# Patient Record
Sex: Male | Born: 1980 | Race: Black or African American | Hispanic: No | Marital: Married | Smoking: Never smoker
Health system: Southern US, Community
[De-identification: ages and names within clinical notes are randomized; demographics above are authoritative.]

## PROBLEM LIST (undated history)

## (undated) DIAGNOSIS — T7840XA Allergy, unspecified, initial encounter: Secondary | ICD-10-CM

## (undated) DIAGNOSIS — Z91018 Allergy to other foods: Secondary | ICD-10-CM

## (undated) DIAGNOSIS — B009 Herpesviral infection, unspecified: Secondary | ICD-10-CM

## (undated) DIAGNOSIS — J309 Allergic rhinitis, unspecified: Secondary | ICD-10-CM

## (undated) HISTORY — DX: Allergic rhinitis, unspecified: J30.9

## (undated) HISTORY — DX: Allergy, unspecified, initial encounter: T78.40XA

## (undated) HISTORY — DX: Herpesviral infection, unspecified: B00.9

## (undated) HISTORY — DX: Allergy to other foods: Z91.018

---
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
## 2012-08-07 ENCOUNTER — Encounter: Payer: Self-pay | Admitting: Physician Assistant

## 2012-08-07 ENCOUNTER — Ambulatory Visit (INDEPENDENT_AMBULATORY_CARE_PROVIDER_SITE_OTHER): Payer: 59 | Admitting: Physician Assistant

## 2012-08-07 VITALS — BP 136/96 | HR 68 | Temp 98.1°F | Resp 18 | Ht 72.25 in | Wt 232.0 lb

## 2012-08-07 DIAGNOSIS — J029 Acute pharyngitis, unspecified: Secondary | ICD-10-CM

## 2012-08-07 LAB — RAPID STREP SCREEN (MED CTR MEBANE ONLY): Streptococcus, Group A Screen (Direct): NEGATIVE

## 2012-08-07 NOTE — Progress Notes (Signed)
   Patient ID: Mark Jarvis MRN: [id number], DOB: 05/25/1980, 32 y.o. Date of Encounter: 08/07/2012, 9:09 AM    Chief Complaint:  Chief Complaint  Patient presents with  . Other    x 1 week white spot in throat  gone now, sore throat off/on     HPI: 32 y.o. year old AA male states that about 6 days ago his throat felt a little sore. He looked at it in the mirror. Saw a little white spot in throat. Called and made appt here. Says his throat has not been sore any more. And, the white spot is now gone.  He has had no nasal congestion/mucus, no cough/chest congestion, no ear ache, no fever/chills.     Home Meds: See attached medication section for any medications that were entered at today's visit. The computer does not put those onto this list.The following list is a list of meds entered prior to today's visit.   No current outpatient prescriptions on file prior to visit.   No current facility-administered medications on file prior to visit.    Allergies: No Known Allergies    Review of Systems: See HPI for pertinent ROS. All other ROS negative.    Physical Exam: Blood pressure 136/96, pulse 68, temperature 98.1 F (36.7 C), temperature source Oral, resp. rate 18, height 6' 0.25" (1.835 m), weight 232 lb (105.235 kg)., Body mass index is 31.25 kg/(m^2). General: WNWD AAM. Appears in no acute distress. HEENT: Normocephalic, atraumatic, eyes without discharge, sclera non-icteric, nares are without discharge. Bilateral auditory canals clear, TM's are without perforation, pearly grey and translucent with reflective cone of light bilaterally. Oral cavity moist, posterior pharynx without exudate, erythema, peritonsillar abscess, or post nasal drip. No "white spots" no exudate. No tonsillar stones. No food material.   Neck: Supple. No thyromegaly. No lymphadenopathy. Lungs: Clear bilaterally to auscultation without wheezes, rales, or rhonchi. Breathing is unlabored. Heart: Regular  rhythm. No murmurs, rubs, or gallops. Msk:  Strength and tone normal for age. Extremities/Skin: Warm and dry. Neuro: Alert and oriented X 3. Moves all extremities spontaneously. Gait is normal. CNII-XII grossly in tact. Psych:  Responds to questions appropriately with a normal affect.   Results for orders placed in visit on 08/07/12  RAPID STREP SCREEN      Result Value Range   Source THROAT     Streptococcus, Group A Screen (Direct) NEG  NEGATIVE     ASSESSMENT AND PLAN:  32 y.o. year old male with  1. Viral pharyngitis  2. Sore throat - Rapid Strep Screen Negative.  Reassure pt that no teatment necessary. White spots he saw may have been sec to food or tonsillar stones. Has resolved.  Signed, [address] [place], [place], [facility] 08/07/2012 9:09 AM

## 2012-11-02 ENCOUNTER — Ambulatory Visit: Payer: 59 | Admitting: Family Medicine

## 2012-11-09 ENCOUNTER — Encounter: Payer: 59 | Admitting: Physician Assistant

## 2012-11-16 ENCOUNTER — Encounter: Payer: Self-pay | Admitting: Physician Assistant

## 2012-11-16 ENCOUNTER — Ambulatory Visit (INDEPENDENT_AMBULATORY_CARE_PROVIDER_SITE_OTHER): Payer: 59 | Admitting: Physician Assistant

## 2012-11-16 VITALS — BP 156/94 | HR 72 | Temp 98.2°F | Resp 18 | Ht 72.25 in | Wt 242.0 lb

## 2012-11-16 DIAGNOSIS — Z0000 Encounter for general adult medical examination without abnormal findings: Secondary | ICD-10-CM

## 2012-11-16 DIAGNOSIS — Z91018 Allergy to other foods: Secondary | ICD-10-CM

## 2012-11-16 DIAGNOSIS — T781XXA Other adverse food reactions, not elsewhere classified, initial encounter: Secondary | ICD-10-CM

## 2012-11-16 HISTORY — DX: Allergy to other foods: Z91.018

## 2012-11-17 NOTE — Progress Notes (Signed)
Patient ID: Mark Jarvis MRN: [id number], DOB: 1980-12-29 32 y.o. Date of Encounter: 11/17/2012, 7:48 PM    Chief Complaint: Physical (CPE)  HPI: 32 y.o. y/o AA male here for CPE.   Has been feeling well and has no complaints and just came in to get it a check up However he later asked if we can do allergy testing while he is here. I explained that he would have to see an allergist for that but asked him why he is requesting this. He reports that recently when eating certain foods he has developed itching around his mouth. Says he has noticed this when eating shrimp, apples, oranges. However he adds that whenever he has had these foods he has not eaten each food alone and has always been eating them together with other foods so is not certain exactly which foods are causing the reaction. He says he is going on a cruise in 2 weeks and wanted to have the testing done prior to the cruise. He has had no other symptoms of allergic reaction and has never had any itching tightness or swelling in his throat. Never had difficulty breathing.   Review of Systems: Consitutional: No fever, chills, fatigue, night sweats, lymphadenopathy, or weight changes. Eyes: No visual changes, eye redness, or discharge. ENT/Mouth: Ears: No otalgia, tinnitus, hearing loss, discharge. Nose: No congestion, rhinorrhea, sinus pain, or epistaxis. Throat: No sore throat, post nasal drip, or teeth pain. Cardiovascular: No CP, palpitations, diaphoresis, DOE, edema, orthopnea, PND. Respiratory: No cough, hemoptysis, SOB, or wheezing. Gastrointestinal: No anorexia, dysphagia, reflux, pain, nausea, vomiting, hematemesis, diarrhea, constipation, BRBPR, or melena. Genitourinary: No dysuria, frequency, urgency, hematuria, incontinence, nocturia, decreased urinary stream, discharge, impotence, or testicular pain/masses. Musculoskeletal: No decreased ROM, myalgias, stiffness, joint swelling, or weakness. Skin: No rash, erythema,  lesion changes, pain, warmth, jaundice, or pruritis. Neurological: No headache, dizziness, syncope, seizures, tremors, memory loss, coordination problems, or paresthesias. Psychological: No anxiety, depression, hallucinations, SI/HI. Endocrine: No fatigue, polydipsia, polyphagia, polyuria, or known diabetes. All other systems were reviewed and are otherwise negative.  Past Medical History  Diagnosis Date  . HSV-1 infection   . Multiple food allergies 11/16/2012     History reviewed. No pertinent past surgical history.  Home Meds:  No current outpatient prescriptions on file prior to visit.   No current facility-administered medications on file prior to visit.    Allergies: No Known Allergies  History   Social History  . Marital Status: Single    Spouse Name: N/A    Number of Children: N/A  . Years of Education: N/A   Occupational History  . 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 Retail banker    Social History Main Topics  . Smoking status: Never Smoker   . Smokeless tobacco: Never Used  . Alcohol Use: No  . Drug Use: No  . Sexual Activity: Not on file   Other Topics Concern  . Not on file   Social History Narrative   Works [employer].   Retail banker   Exercises: Mon/ Wed/Fri--Lifts Weights                    Tues/ Thurs--Runs 1.5 miles then leg exercises.      Single. Lives alone.    History reviewed. No pertinent family history.  Physical Exam: Blood pressure 136/84, pulse 72, temperature 98.2 F (36.8 C), temperature source Oral, resp. rate 18, height 6' 0.25" (1.835 m),  weight 242 lb (109.77 kg).  General: Well developed, well nourished,AAM. in no acute distress. HEENT: Normocephalic, atraumatic. Conjunctiva pink, sclera non-icteric. Pupils 2 mm constricting to 1 mm, round, regular, and equally reactive to light and accomodation. EOMI. Internal auditory canal clear. TMs with good cone of light and without  pathology. Nasal mucosa pink. Nares are without discharge. No sinus tenderness. Oral mucosa pink. Dentition good. Pharynx without exudate.   Neck: Supple. Trachea midline. No thyromegaly. Full ROM. No lymphadenopathy. Lungs: Clear to auscultation bilaterally without wheezes, rales, or rhonchi. Breathing is of normal effort and unlabored. Cardiovascular: RRR with S1 S2. No murmurs, rubs, or gallops. Distal pulses 2+ symmetrically. No carotid or abdominal bruits. Abdomen: Soft, non-tender, non-distended with normoactive bowel sounds. No hepatosplenomegaly or masses. No rebound/guarding. No CVA tenderness. No hernias. Musculoskeletal: Full range of motion and 5/5 strength throughout. Without swelling, atrophy, tenderness, crepitus, or warmth. Extremities without clubbing, cyanosis, or edema. Calves supple. Skin: Warm and moist without erythema, ecchymosis, wounds, or rash. Neuro: A+Ox3. CN II-XII grossly intact. Moves all extremities spontaneously. Full sensation throughout. Normal gait. DTR 2+ throughout upper and lower extremities. Finger to nose intact. Psych:  Responds to questions appropriately with a normal affect.   Assessment/Plan:  32 y.o. y/o  male here for CPE -1. Visit for preventive health examination A. Screening Labs: He is not fasting but will return to the clinic fasting for labs. - CBC with Differential; Future - COMPLETE METABOLIC PANEL WITH GFR; Future - Lipid panel; Future - TSH; Future - Vit D  25 hydroxy (rtn osteoporosis monitoring); Future  Immunizations: He defers flu vaccine. He reports a tetanus vaccine is up-to-date.  2. Multiple food allergies, initial encounter - Ambulatory referral to Allergy   Signed:   [address] [place], [place]  11/17/2012 7:48 PM

## 2012-11-20 ENCOUNTER — Other Ambulatory Visit: Payer: 59

## 2012-11-20 DIAGNOSIS — Z0000 Encounter for general adult medical examination without abnormal findings: Secondary | ICD-10-CM

## 2012-11-20 LAB — CBC WITH DIFFERENTIAL/PLATELET
Basophils Relative: 1 % (ref 0–1)
Eosinophils Absolute: 0.1 10*3/uL (ref 0.0–0.7)
MCH: 28 pg (ref 26.0–34.0)
MCHC: 33.7 g/dL (ref 30.0–36.0)
Neutrophils Relative %: 41 % — ABNORMAL LOW (ref 43–77)
Platelets: 225 10*3/uL (ref 150–400)
WBC: 4.3 10*3/uL (ref 4.0–10.5)

## 2012-11-20 LAB — COMPLETE METABOLIC PANEL WITH GFR
ALT: 34 U/L (ref 0–53)
Alkaline Phosphatase: 58 U/L (ref 39–117)
GFR, Est Non African American: 86 mL/min
Glucose, Bld: 81 mg/dL (ref 70–99)
Sodium: 139 mEq/L (ref 135–145)
Total Bilirubin: 0.7 mg/dL (ref 0.3–1.2)
Total Protein: 7.4 g/dL (ref 6.0–8.3)

## 2012-11-20 LAB — VITAMIN D 25 HYDROXY (VIT D DEFICIENCY, FRACTURES): Vit D, 25-Hydroxy: 29 ng/mL — ABNORMAL LOW (ref 30–89)

## 2012-11-20 LAB — LIPID PANEL
HDL: 45 mg/dL (ref >39)
Total CHOL/HDL Ratio: 4.3 Ratio

## 2012-11-20 LAB — TSH: TSH: 1.698 u[IU]/mL (ref 0.350–4.500)

## 2012-11-23 ENCOUNTER — Encounter: Payer: Self-pay | Admitting: Family Medicine

## 2013-01-30 ENCOUNTER — Ambulatory Visit (INDEPENDENT_AMBULATORY_CARE_PROVIDER_SITE_OTHER): Payer: 59 | Admitting: Family Medicine

## 2013-01-30 ENCOUNTER — Encounter: Payer: Self-pay | Admitting: Family Medicine

## 2013-01-30 VITALS — BP 118/80 | HR 82 | Temp 97.7°F | Resp 18 | Wt 246.0 lb

## 2013-01-30 DIAGNOSIS — S83509A Sprain of unspecified cruciate ligament of unspecified knee, initial encounter: Secondary | ICD-10-CM

## 2013-01-30 DIAGNOSIS — S83511A Sprain of anterior cruciate ligament of right knee, initial encounter: Secondary | ICD-10-CM

## 2013-01-30 NOTE — Progress Notes (Signed)
   Subjective:    Patient ID: Mark Jarvis, male    DOB: 1981-01-19, 33 y.o.   MRN: [id number]  HPI Patient is a very pleasant 33 year old [place] male who comes in with right knee pain.  He is a very muscular build. He is active in [employer]. 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  Therefore his jobs require physical strength and physical exertion.  Recently he was playing basketball. He jumped up to block a shot and when he landed he hyperextended his right knee. He felt immediate pain in the posterolateral aspect of the right knee. Ever since the knee joint feels unstable. He has minimal pain. When he does have pain it is well controlled with ibuprofen. However he feels very unsteady on his legs. He does like his knee joint may give out on him. Past Medical History  Diagnosis Date  . HSV-1 infection   . Multiple food allergies 11/16/2012   No current outpatient prescriptions on file prior to visit.   No current facility-administered medications on file prior to visit.   No Known Allergies History   Social History  . Marital Status: Single    Spouse Name: N/A    Number of Children: N/A  . Years of Education: N/A   Occupational History  . [employer]   . [employer]    Social History Main Topics  . Smoking status: Never Smoker   . Smokeless tobacco: Never Used  . Alcohol Use: No  . Drug Use: No  . Sexual Activity: Not on file   Other Topics Concern  . Not on file   Social History Narrative   [place] [employer]   [employer]   Exercises: Mon/ Wed/Fri--Lifts Weights                    Tues/ Thurs--Runs 1.5 miles then leg exercises.      Single. Lives alone.      Review of Systems  All other systems reviewed and are negative.       Objective:   Physical Exam  Vitals reviewed. Cardiovascular: Normal rate and regular rhythm.     Pulmonary/Chest: Effort normal and breath sounds normal.  Musculoskeletal:       Right knee: He exhibits abnormal meniscus. He exhibits normal range of motion, no swelling, no effusion, no ecchymosis, no erythema, normal alignment, no LCL laxity, normal patellar mobility, no bony tenderness and no MCL laxity. No medial joint line, no lateral joint line, no MCL and no LCL tenderness noted.   patient has tenderness with Aply grind test in the right postero-lateral meniscus.  Anterior drawer sign is positive for excessive forward movement of the tibia although the exam is difficult given his muscular build.        Assessment & Plan:  1. ACL sprain, right, initial encounter I suspect a sprain in the right anterior cruciate ligament or possibly a rupture. The patient's exam is difficult given his muscular build. Therefore I will schedule an MRI of the right knee as soon as possible.  I am also concerned he may have partially torn a meniscus. - MR Knee Right Wo Contrast; Future

## 2013-02-04 ENCOUNTER — Other Ambulatory Visit: Payer: Self-pay

## 2013-02-05 ENCOUNTER — Ambulatory Visit
Admission: RE | Admit: 2013-02-05 | Discharge: 2013-02-05 | Disposition: A | Discharge status: Home / Self Care | Payer: 59 | Source: Ambulatory Visit | Attending: Family Medicine | Admitting: Family Medicine

## 2013-02-05 DIAGNOSIS — S83511A Sprain of anterior cruciate ligament of right knee, initial encounter: Secondary | ICD-10-CM

## 2013-02-06 ENCOUNTER — Encounter: Payer: Self-pay | Admitting: Physician Assistant

## 2013-02-06 NOTE — Telephone Encounter (Signed)
Pt is calling about his test results Call back number is [phone]

## 2013-02-08 ENCOUNTER — Other Ambulatory Visit: Payer: Self-pay | Admitting: Family Medicine

## 2013-02-08 ENCOUNTER — Encounter: Payer: Self-pay | Admitting: Family Medicine

## 2013-02-08 DIAGNOSIS — M25569 Pain in unspecified knee: Secondary | ICD-10-CM

## 2013-02-09 NOTE — Telephone Encounter (Signed)
This encounter was created in error - please disregard.

## 2013-02-16 ENCOUNTER — Encounter: Payer: Self-pay | Admitting: Family Medicine

## 2013-02-16 DIAGNOSIS — T7840XA Allergy, unspecified, initial encounter: Secondary | ICD-10-CM | POA: Insufficient documentation

## 2013-02-16 DIAGNOSIS — J309 Allergic rhinitis, unspecified: Secondary | ICD-10-CM | POA: Insufficient documentation

## 2013-07-16 ENCOUNTER — Encounter: Payer: Self-pay | Admitting: Physician Assistant

## 2013-07-16 ENCOUNTER — Ambulatory Visit (INDEPENDENT_AMBULATORY_CARE_PROVIDER_SITE_OTHER): Admitting: Physician Assistant

## 2013-07-16 VITALS — BP 126/76 | HR 64 | Temp 98.4°F | Resp 18 | Wt 248.0 lb

## 2013-07-16 DIAGNOSIS — L259 Unspecified contact dermatitis, unspecified cause: Secondary | ICD-10-CM

## 2013-07-16 DIAGNOSIS — L239 Allergic contact dermatitis, unspecified cause: Secondary | ICD-10-CM

## 2013-07-16 MED ORDER — PREDNISONE 20 MG PO TABS: ORAL_TABLET | ORAL | Status: DC

## 2013-07-16 MED ORDER — METHYLPREDNISOLONE ACETATE 80 MG/ML IJ SUSP
80.0000 mg | Freq: Once | INTRAMUSCULAR | Status: AC
  Administered 2013-07-16: 80 mg via INTRAMUSCULAR

## 2013-07-16 NOTE — Addendum Note (Signed)
Addended by: Donne AnonPLUMMER, KIM M on: 07/16/2013 04:25 PM   Modules accepted: Orders

## 2013-07-16 NOTE — Progress Notes (Signed)
    Patient ID: Mark Jarvis MRN: [id number], DOB: November 11, 1980, 33 y.o. Date of Encounter: 07/16/2013, 3:55 PM    Chief Complaint:  Chief Complaint  Patient presents with  . poison all over     HPI: 33 y.o. year old AA male says that he has gotten into some poison oak or poison ivy. Says that he first noticed area of itchy rash on his neck on Wednesday 07/11/13. Then it spread to his wrist and since then has spread up both arms. Has some splotches on his chin and has areas on his legs as well. Has had no swelling or itching in the throat or neck and has had no difficulty breathing.     Home Meds:   Outpatient Prescriptions Prior to Visit  Medication Sig Dispense Refill  . diphenhydrAMINE (BENADRYL) 50 MG tablet Take 50 mg by mouth every 4 (four) hours as needed for allergies.      Marland Kitchen. EPINEPHrine (EPI-PEN) 0.3 mg/0.3 mL SOAJ injection Inject 0.3 mg into the muscle once.      . fluticasone (FLONASE) 50 MCG/ACT nasal spray Place 2 sprays into both nostrils daily.      . cetirizine (ZYRTEC) 10 MG tablet Take 10 mg by mouth daily.       No facility-administered medications prior to visit.    Allergies:  Allergies  Allergen Reactions  . Other     GRAPES  . Shrimp [Shellfish Allergy]       Review of Systems: See HPI for pertinent ROS. All other ROS negative.    Physical Exam: Blood pressure 126/76, pulse 64, temperature 98.4 F (36.9 C), temperature source Oral, resp. rate 18, weight 248 lb (112.492 kg)., Body mass index is 33.41 kg/(m^2). General:  AAM. Appears in no acute distress. Neck: Supple. No thyromegaly. No lymphadenopathy. Lungs: Clear bilaterally to auscultation without wheezes, rales, or rhonchi. Breathing is unlabored. Heart: Regular rhythm. No murmurs, rubs, or gallops. Msk:  Strength and tone normal for age. Extremities/Skin: He has patches of pink vessiicles on his wrists/forearms. He also has pink papules --some of them in linear distribution-- on his  forearms. He has pink papules splotch over his legs and some splotches on his chin and neck. Neuro: Alert and oriented X 3. Moves all extremities spontaneously. Gait is normal. CNII-XII grossly in tact. Psych:  Responds to questions appropriately with a normal affect.     ASSESSMENT AND PLAN:  33 y.o. year old male with  1. Allergic dermatitis Given his size I think he is going to need injection of 80 mg IM now. Then starting tomorrow he will start oral taper as below.  He says that he has had to miss the last 3 days of work because of this so I'm giving him a note for out of work for 6/19, 6/20,  6/21. If the itching and the rash do not resolve over the next 48 hours, or if it returns, then he is to follow up. - predniSONE (DELTASONE) 20 MG tablet; Take 3 daily for 2 days, then 2 daily for 2 days, then 1 daily for 2 days.  Dispense: 12 tablet; Refill: 0   Signed, [address], [place], [facility] 07/16/2013 3:55 PM

## 2014-02-25 DIAGNOSIS — J309 Allergic rhinitis, unspecified: Secondary | ICD-10-CM | POA: Insufficient documentation

## 2014-10-23 ENCOUNTER — Encounter: Payer: Self-pay | Admitting: Family Medicine

## 2014-10-23 ENCOUNTER — Ambulatory Visit (INDEPENDENT_AMBULATORY_CARE_PROVIDER_SITE_OTHER): Admitting: Family Medicine

## 2014-10-23 VITALS — BP 126/68 | HR 88 | Temp 97.8°F | Resp 16 | Ht 72.0 in | Wt 242.0 lb

## 2014-10-23 DIAGNOSIS — M7661 Achilles tendinitis, right leg: Secondary | ICD-10-CM

## 2014-10-23 MED ORDER — MELOXICAM 15 MG PO TABS: 15.0000 mg | ORAL_TABLET | Freq: Every day | ORAL | Status: DC

## 2014-10-23 NOTE — Progress Notes (Signed)
Patient ID: Mark Jarvis, male   DOB: 04-16-1980, 34 y.o.   MRN: [id number]   Subjective:    Patient ID: Mark Jarvis, male    DOB: [birth year], 33 y.o.   MRN: [id number]  Patient presents for R Achilles Tendon Pain  Patient here with right before meals least pain. He was out running a few weeks ago. He noticed some soreness in his foot after exercising but he never felt a pop or had any significant swelling initially. He noted that he continue to exercise is pain when he running or walking long distances typically over the Achilles area. He has had swelling the past couple weeks that he has not used any ice is not taking any laboratories. He was trying to work through it as he is currently active in the [employer]. He is due to start his PT this weekend. He denies any knee pain. Pain or back pain.   Review Of Systems:  GEN- denies fatigue, fever, weight loss,weakness, recent illness HEENT- denies eye drainage, change in vision, nasal discharge, CVS- denies chest pain, palpitations RESP- denies SOB, cough, wheeze ABD- denies N/V, change in stools, abd pain GU- denies dysuria, hematuria, dribbling, incontinence MSK- + joint pain, muscle aches, injury Neuro- denies headache, dizziness, syncope, seizure activity       Objective:    BP 126/68 mmHg  Pulse 88  Temp(Src) 97.8 F (36.6 C) (Oral)  Resp 16  Ht 6' (1.829 m)  Wt 242 lb (109.77 kg)  BMI 32.81 kg/m2 GEN- NAD, alert and oriented x3 MSK- Right ankle- good ROM, TTP over achilles, small nodule/swelling palpated center of Achilles, thompson test normal,neg squeeze test over midfoot Skin - intact         Assessment & Plan:      Problem List Items Addressed This Visit    None    Visit Diagnoses    Achilles tendinitis of right lower extremity    -  Primary    ICE, NSAIDS, obtain xray due to the swelling, pain just above achilles insertion, doubt tear based on range of motion, 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 Any transcriptional errors that result from this process are unintentional.

## 2014-10-23 NOTE — Patient Instructions (Addendum)
ICE, Get xray of  Of ankle -- [address]  Take anti-inflammatory  F/u 4 WEEKS FOR RECHECK If not improved Achilles Tendinitis Achilles tendinitis is inflammation of the tough, cord-like band that attaches the lower muscles of your leg to your heel (Achilles tendon). It is usually caused by overusing the tendon and joint involved.  CAUSES Achilles tendinitis can happen because of:  A sudden increase in exercise or activity (such as running).  Doing the same exercises or activities (such as jumping) over and over.  Not warming up calf muscles before exercising.  Exercising in shoes that are worn out or not made for exercise.  Having arthritis or a bone growth on the back of the heel bone. This can rub against the tendon and hurt the tendon. SIGNS AND SYMPTOMS The most common symptoms are:  Pain in the back of the leg, just above the heel. The pain usually gets worse with exercise and better with rest.  Stiffness or soreness in the back of the leg, especially in the morning.  Swelling of the skin over the Achilles tendon.  Trouble standing on tiptoe. Sometimes, an Achilles tendon tears (ruptures). Symptoms of an Achilles tendon rupture can include:  Sudden, severe pain in the back of the leg.  Trouble putting weight on the foot or walking normally. DIAGNOSIS Achilles tendinitis will be diagnosed based on symptoms and a physical examination. An X-ray may be done to check if another condition is causing your symptoms. An MRI may be ordered if your health care provider suspects you may have completely torn your tendon, which is called an Achilles tendon rupture.  TREATMENT  Achilles tendinitis usually gets better over time. It can take weeks to months to heal completely. Treatment focuses on treating the symptoms and helping the injury heal. HOME CARE INSTRUCTIONS   Rest your Achilles tendon and avoid activities that cause pain.  Apply ice to the injured  area:  Put ice in a plastic bag.  Place a towel between your skin and the bag.  Leave the ice on for 20 minutes, 2-3 times a day  Try to avoid using the tendon (other than gentle range of motion) while the tendon is painful. Do not resume use until instructed by your health care provider. Then begin use gradually. Do not increase use to the point of pain. If pain does develop, decrease use and continue the above measures. Gradually increase activities that do not cause discomfort until you achieve normal use.  Do exercises to make your calf muscles stronger and more flexible. Your health care provider or physical therapist can recommend exercises for you to do.  Wrap your ankle with an elastic bandage or other wrap. This can help keep your tendon from moving too much. Your health care provider will show you how to wrap your ankle correctly.  Only take over-the-counter or prescription medicines for pain, discomfort, or fever as directed by your health care provider. SEEK MEDICAL CARE IF:   Your pain and swelling increase or pain is uncontrolled with medicines.  You develop new, unexplained symptoms or your symptoms get worse.  You are unable to move your toes or foot.  You develop warmth and swelling in your foot.  You have an unexplained temperature. MAKE SURE YOU:   Understand these instructions.  Will watch your condition.  Will get help right away if you are not doing well or get worse. Document Released: 10/21/2004 Document Revised: 11/01/2012 Document Reviewed: 08/23/2012  ExitCare Patient Information 2015 [employer]. This information is not intended to replace advice given to you by your health care provider. Make sure you discuss any questions you have with your health care provider.  

## 2014-10-28 ENCOUNTER — Ambulatory Visit
Admission: RE | Admit: 2014-10-28 | Discharge: 2014-10-28 | Disposition: A | Discharge status: Home / Self Care | Payer: 59 | Source: Ambulatory Visit | Attending: Family Medicine | Admitting: Family Medicine

## 2014-10-28 ENCOUNTER — Other Ambulatory Visit: Payer: Self-pay | Admitting: *Deleted

## 2014-10-28 DIAGNOSIS — M7661 Achilles tendinitis, right leg: Secondary | ICD-10-CM

## 2014-11-26 ENCOUNTER — Other Ambulatory Visit (INDEPENDENT_AMBULATORY_CARE_PROVIDER_SITE_OTHER): Payer: 59

## 2014-11-26 ENCOUNTER — Encounter: Payer: Self-pay | Admitting: Family Medicine

## 2014-11-26 ENCOUNTER — Encounter: Payer: Self-pay | Admitting: *Deleted

## 2014-11-26 ENCOUNTER — Ambulatory Visit (INDEPENDENT_AMBULATORY_CARE_PROVIDER_SITE_OTHER): Payer: 59 | Admitting: Family Medicine

## 2014-11-26 VITALS — BP 118/80 | HR 70 | Ht 72.0 in | Wt 246.0 lb

## 2014-11-26 DIAGNOSIS — M6788 Other specified disorders of synovium and tendon, other site: Secondary | ICD-10-CM | POA: Insufficient documentation

## 2014-11-26 DIAGNOSIS — M7661 Achilles tendinitis, right leg: Secondary | ICD-10-CM

## 2014-11-26 DIAGNOSIS — M67879 Other specified disorders of synovium and tendon, unspecified ankle and foot: Secondary | ICD-10-CM | POA: Diagnosis not present

## 2014-11-26 DIAGNOSIS — M7660 Achilles tendinitis, unspecified leg: Secondary | ICD-10-CM

## 2014-11-26 NOTE — Progress Notes (Signed)
Pre visit review using our clinic review tool, if applicable. No additional management support is needed unless otherwise documented below in the visit note. 

## 2014-11-26 NOTE — Assessment & Plan Note (Signed)
Patient is a nodule noted 2 cm from the insertion. I do not see any tearing at the insertion but this is a concern for possible rupture at this level if continuing to enlarge. Patient is in [employer] and we will limited his activity avoiding any jumping or running at this time. Patient given home exercises and work with Event organiserathletic trainer. We discussed topical anti-inflammatories, icing protocol, patient heel lift into the shoe with daily activities. Patient will then come back and see me again in 3 weeks. If doing better we will advance accordingly. If doing worse patient would likely need to be nonweightbearing for 1-2 weeks.

## 2014-11-26 NOTE — Patient Instructions (Signed)
Good to see you.  Ice 20 minutes 2 times daily. Usually after activity and before bed. Exercises 3 times a week.  Avoid running, jumping or heavy lifting with legs pennsaid pinkie amount topically 2 times daily as needed.  Heel lift in right shoe See me again in 3-4 weeks

## 2014-11-26 NOTE — Progress Notes (Signed)
[facility] [address] [place], [place] [address] Phone: [phone] Subjective:    I'm seeing this patient by the request  of:  [facility] BETH, PA-C Dr. Jeanice Lim  CC: right ankle pain  [id number] Mark Jarvis is a 34 y.o. male coming in with complaint of right ankle pain. Patient states that he was running and started noticing a soreness in his foot and then the posterior aspect of his ankle. Never felt a pop. Patient is to remember any true swelling. Continue to try to exercise but continued to have pain. Seemed to start hurting him more on shorter and shorter distances. Patient then started having some mild swelling noted. Patient did not try any significant home modalities. 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 Patient rates the severity of pain is 4 out of 10 at this time. Seems to be worse after activity. Continues to run but when he tries to decelerate that seems to be the most pain.    Past Medical History  Diagnosis Date  . HSV-1 infection   . Multiple food allergies 11/16/2012  . Allergy     tree pollens,dust mites,cats,grasses  . Allergic rhinitis    No past surgical history on file. Social History  Substance Use Topics  . Smoking status: Never Smoker   . Smokeless tobacco: Never Used  . Alcohol Use: No   Allergies  Allergen Reactions  . Other     GRAPES  . Shrimp [Shellfish Allergy]      No family history on file.  Past medical history, social, surgical and family history all reviewed in electronic medical record.   Review of Systems: No headache, visual changes, nausea, vomiting, diarrhea, constipation, dizziness, abdominal pain, skin rash, fevers, chills, night sweats, weight loss, swollen lymph nodes, body aches, joint swelling, muscle aches, chest pain, shortness of breath, mood changes.   Objective Blood pressure 118/80, pulse 70, height 6' (1.829 m), weight 246 lb (111.585 kg), SpO2 97  %.  General: No apparent distress alert and oriented x3 mood and affect normal, dressed appropriately.  HEENT: Pupils equal, extraocular movements intact  Respiratory: Patient's speak in full sentences and does not appear short of breath  Cardiovascular: No lower extremity edema, non tender, no erythema  Skin: Warm dry intact with no signs of infection or rash on extremities or on axial skeleton.  Abdomen: Soft nontender  Neuro: Cranial nerves II through XII are intact, neurovascularly intact in all extremities with 2+ DTRs and 2+ pulses.  Lymph: No lymphadenopathy of posterior or anterior cervical chain or axillae bilaterally.  Gait normal with good balance and coordination.  MSK:  Non tender with full range of motion and good stability and symmetric strength and tone of shoulders, elbows, wrist, hip, knee bilaterally.  Ankle:right ankle Nodule noted over the right Achilles tendon this is tender to palpation. Range of motion is full in all directions. Strength is 5/5 in all directions. Stable lateral and medial ligaments; squeeze test and kleiger test unremarkable; Talar dome nontender; No pain at base of 5th MT; No tenderness over cuboid; No tenderness over N spot or navicular prominence No tenderness on posterior aspects of lateral and medial malleolus No sign of peroneal tendon subluxations or tenderness to palpation Negative tarsal tunnel tinel's Able to walk 4 steps. Negative Thompson test  MSK US performed of: right This study was ordered, performed, and interpreted by Terrilee Files D.O.  Foot/Ankle:   All structures visualized.  Talar dome unremarkable  Ankle mortise without effusion. Peroneus longus and brevis tendons unremarkable on long and transverse views without sheath effusions. Posterior tibialis, flexor hallucis longus, and flexor digitorum longus tendons unremarkable on long and transverse views without sheath effusions. Achilles tendon visualized along length of  tendon patient is a very large nodule noted proximally 2 cm proximal to its insertion. Hypoechoic changes and increasing Doppler flow noted. No true tear appreciated. No hypoechoic changes at the insertion itself. Anterior Talofibular Ligament and Calcaneofibular Ligaments unremarkable and intact. Deltoid Ligament unremarkable and intact. Plantar fascia intact and without effusion, normal thickness. No increased doppler signal, cap sign, or thickening of tibial cortex. Power doppler signal normal.  IMPRESSION:  Achilles tendinosis  Procedure note 97110; 15 minutes spent for Therapeutic exercises as stated in above notes.  This included exercises focusing on stretching, strengthening, with significant focus on eccentric aspects. Basic range of motion exercises to allow proper full motion at ankle Stretching of the lower leg and hamstrings  Theraband exercises for the lower leg - inversion, eversion, dorsiflexion and plantarflexion each to be completed with a theraband Balance exercises to increase proprioception Weight bearing exercises to increase strength and balance Proper technique shown and discussed handout in great detail with ATC.  All questions were discussed and answered.     Impression and Recommendations:     This case required medical decision making of moderate complexity.

## 2014-12-17 ENCOUNTER — Ambulatory Visit (INDEPENDENT_AMBULATORY_CARE_PROVIDER_SITE_OTHER): Payer: 59 | Admitting: Family Medicine

## 2014-12-17 ENCOUNTER — Encounter: Payer: Self-pay | Admitting: Family Medicine

## 2014-12-17 VITALS — BP 116/76 | HR 60 | Wt 244.0 lb

## 2014-12-17 DIAGNOSIS — M2141 Flat foot [pes planus] (acquired), right foot: Secondary | ICD-10-CM | POA: Diagnosis not present

## 2014-12-17 DIAGNOSIS — M2142 Flat foot [pes planus] (acquired), left foot: Secondary | ICD-10-CM | POA: Diagnosis not present

## 2014-12-17 DIAGNOSIS — M7661 Achilles tendinitis, right leg: Secondary | ICD-10-CM

## 2014-12-17 NOTE — Assessment & Plan Note (Signed)
Patient has made improvement. Encourage him to get a heel lift. We discussed possibly even a Cam Walker patient has any exacerbation. Patient does have significant pes planus that could also be contribute in. We may need to consider custom orthotics at some point. Patient has chronic continue with conservative therapy over the next 6 weeks and patient given an exercise per Scripture. Patient come back and see me again in 6 weeks for further evaluation and treatment.

## 2014-12-17 NOTE — Patient Instructions (Signed)
Good to see you Get the heel lift at [facility].  Ice is your friend Start running 2 times a week for the next 2 weeks then 3 times a week for 2 weeks then as much as you want Would avoid sprinting.  Continue the exercises See me again in 6 weeks.  If worse we may need to consider boot or custom orthotics.

## 2014-12-17 NOTE — Progress Notes (Signed)
[facility] [address] [place], [place] [address] Phone: [phone] Subjective:    I'm seeing this patient by the request  of:  [facility] BETH, PA-C Dr. Jeanice Lim   CC: right ankle pain follow up  [id number] Mark Jarvis is a 34 y.o. male coming in with complaint of right ankle pain. Patient was found to have more of an Achilles tendon notices without any significant inflammation. Patient was taking anti-inflammatory was to do home exercises, and was to get a heel lift. Patient did not get a heel lift and has been doing the exercises occasionally. Patient has not tried to run. Has been doing some other working out. Denies any numbness or tingling. States that he would state East 50-60% better. Still can be sore.    Past Medical History  Diagnosis Date  . HSV-1 infection   . Multiple food allergies 11/16/2012  . Allergy     tree pollens,dust mites,cats,grasses  . Allergic rhinitis    History reviewed. No pertinent past surgical history. Social History  Substance Use Topics  . Smoking status: Never Smoker   . Smokeless tobacco: Never Used  . Alcohol Use: No   Allergies  Allergen Reactions  . Other     GRAPES  . Shrimp [Shellfish Allergy]      History reviewed. No pertinent family history.  Past medical history, social, surgical and family history all reviewed in electronic medical record.   Review of Systems: No headache, visual changes, nausea, vomiting, diarrhea, constipation, dizziness, abdominal pain, skin rash, fevers, chills, night sweats, weight loss, swollen lymph nodes, body aches, joint swelling, muscle aches, chest pain, shortness of breath, mood changes.   Objective Blood pressure 116/76, pulse 60, weight 244 lb (110.678 kg), SpO2 98 %.  General: No apparent distress alert and oriented x3 mood and affect normal, dressed appropriately.  HEENT: Pupils equal, extraocular movements intact  Respiratory: Patient's speak in full  sentences and does not appear short of breath  Cardiovascular: No lower extremity edema, non tender, no erythema  Skin: Warm dry intact with no signs of infection or rash on extremities or on axial skeleton.  Abdomen: Soft nontender  Neuro: Cranial nerves II through XII are intact, neurovascularly intact in all extremities with 2+ DTRs and 2+ pulses.  Lymph: No lymphadenopathy of posterior or anterior cervical chain or axillae bilaterally.  Gait normal with good balance and coordination.  MSK:  Non tender with full range of motion and good stability and symmetric strength and tone of shoulders, elbows, wrist, hip, knee bilaterally.  Ankle:right ankle Nodule noted over the right Achilles tendon but smaller and less tender than previous exam. Still not pain-free. Range of motion is full in all directions. Strength is 5/5 in all directions. Stable lateral and medial ligaments; squeeze test and kleiger test unremarkable; Talar dome nontender; No pain at base of 5th MT; No tenderness over cuboid; No tenderness over N spot or navicular prominence No tenderness on posterior aspects of lateral and medial malleolus No sign of peroneal tendon subluxations or tenderness to palpation Negative tarsal tunnel tinel's Able to walk 4 steps. Negative Thompson test  Foot exam shows the patient does have severe pes planus bilaterally with mild over pronation of the hindfoot  MSK US performed of: right This study was ordered, performed, and interpreted by Terrilee Files D.O.  Foot/Ankle:   All structures visualized.   Talar dome unremarkable  Ankle mortise without effusion. Peroneus longus and brevis tendons unremarkable on  long and transverse views without sheath effusions. Posterior tibialis, flexor hallucis longus, and flexor digitorum longus tendons unremarkable on long and transverse views without sheath effusions. Achilles tendon visualized along length of tendon patient patient's nodule is 50%  decrease from previous exam. No true tear appreciated. No hypoechoic changes at the insertion itself. Anterior Talofibular Ligament and Calcaneofibular Ligaments unremarkable and intact. Deltoid Ligament unremarkable and intact. Plantar fascia intact and without effusion, normal thickness. No increased doppler signal, cap sign, or thickening of tibial cortex. Power doppler signal normal.  IMPRESSION:  Achilles tendinosis with no hypoechoic changes and seems to be smaller than previous exam    Impression and Recommendations:     This case required medical decision making of moderate complexity.

## 2015-01-28 ENCOUNTER — Ambulatory Visit (INDEPENDENT_AMBULATORY_CARE_PROVIDER_SITE_OTHER): Payer: 59 | Admitting: Family Medicine

## 2015-01-28 ENCOUNTER — Ambulatory Visit (INDEPENDENT_AMBULATORY_CARE_PROVIDER_SITE_OTHER): Payer: 59

## 2015-01-28 ENCOUNTER — Encounter: Payer: Self-pay | Admitting: Family Medicine

## 2015-01-28 VITALS — BP 138/80 | HR 83 | Ht 72.0 in | Wt 250.0 lb

## 2015-01-28 VITALS — BP 118/72 | HR 66 | Temp 98.3°F | Resp 14 | Ht 72.0 in | Wt 250.0 lb

## 2015-01-28 DIAGNOSIS — M7661 Achilles tendinitis, right leg: Secondary | ICD-10-CM

## 2015-01-28 DIAGNOSIS — M545 Low back pain, unspecified: Secondary | ICD-10-CM

## 2015-01-28 DIAGNOSIS — G8929 Other chronic pain: Secondary | ICD-10-CM | POA: Diagnosis not present

## 2015-01-28 MED ORDER — CYCLOBENZAPRINE HCL 10 MG PO TABS: 10.0000 mg | ORAL_TABLET | Freq: Three times a day (TID) | ORAL | Status: DC | PRN

## 2015-01-28 MED ORDER — IBUPROFEN 800 MG PO TABS: 800.0000 mg | ORAL_TABLET | Freq: Three times a day (TID) | ORAL | Status: DC | PRN

## 2015-01-28 NOTE — Progress Notes (Signed)
Patient ID: Mark Jarvis, male   DOB: 03/24/1980, 35 y.o.   MRN: [id number]      Subjective:    Patient ID: Mark ShownDarien K Jarvis, male    DOB: 03/24/1980, 35 y.o.   MRN: [id number]  Patient presents for R Lumbar Pain  Pt here with recurrent right low back pain. History of back pain for past few years. No specific injury he is aware of but , occurred initially while he was over sees, he thinks due to bumpy rides in the [facility] [place] and wearing the heavy packs.  He denies any radiating pain, mostly sits in right lower back. It flares up on and off, he will also get spasm. No change in bowel or bladder. Had flares 2 weeks, ago, always in same side, used Ibuprofen 800mg  and pain resolved. He is able to do regular activities, advised by his supervisors to have this evaluated.     Review Of Systems:  GEN- denies fatigue, fever, weight loss,weakness, recent illness HEENT- denies eye drainage, change in vision, nasal discharge, CVS- denies chest pain, palpitations RESP- denies SOB, cough, wheeze ABD- denies N/V, change in stools, abd pain GU- denies dysuria, hematuria, dribbling, incontinence MSK- +joint pain, +muscle aches, injury Neuro- denies headache, dizziness, syncope, seizure activity       Objective:    BP 118/72 mmHg  Pulse 66  Temp(Src) 98.3 F (36.8 C) (Oral)  Resp 14  Ht 6' (1.829 m)  Wt 250 lb (113.399 kg)  BMI 33.90 kg/m2 GEN- NAD, alert and oriented x3 MSK- TTP right paraspinals, Lumbar spine NT, neg SLR, good ROM spine, hips Neuro- normal tone LE, motor equal bilat, DTR symmetric, sensation in tact  EXT- No edema Pulses- Radial  2+        Assessment & Plan:      Problem List Items Addressed This Visit    None    Visit Diagnoses    Chronic low back pain    -  Primary    More MSK pain, no sign of spinal stenosis or nerve impingment, Given Ibuprofen 800mg  and Flexeril prn, obtain XRAY l spine, no current flare    Relevant Medications    ibuprofen (ADVIL,MOTRIN) 800  MG tablet    cyclobenzaprine (FLEXERIL) 10 MG tablet    Other Relevant Orders    DG Lumbar Spine Complete       Note: This dictation was prepared with Dragon dictation along with smaller phrase technology. Any transcriptional errors that result from this process are unintentional.

## 2015-01-28 NOTE — Progress Notes (Signed)
[facility] [address] [place], [place] [address] Phone: [phone] Subjective:       CC: right ankle pain follow up  Mark Jarvis is a 35 y.o. male coming in with complaint of right ankle pain. Patient was found to have more of an Achilles tendon notices without any significant inflammation. Patient was taking anti-inflammatory was to do home exercises, and was to get a heel lift. Patient did not get a heel lift and has been doing the exercises occasionally. Patient has started lifting a more regular basis. Patient is going to the gym and did not notice any significant pain. Feels some tightness overall. Proximal wing 90% better than his previous visit. No new symptoms.     Past Medical History  Diagnosis Date  . HSV-1 infection   . Multiple food allergies 11/16/2012  . Allergy     tree pollens,dust mites,cats,grasses  . Allergic rhinitis    No past surgical history on file. Social History  Substance Use Topics  . Smoking status: Never Smoker   . Smokeless tobacco: Never Used  . Alcohol Use: No   Allergies  Allergen Reactions  . Other     GRAPES     No family history on file. no pertinent family history as  Past medical history, social, surgical and family history all reviewed and no pertinent info pertaining to chief complaint. All other was reviewed using the EMR.    Review of Systems: No headache, visual changes, nausea, vomiting, diarrhea, constipation, dizziness, abdominal pain, skin rash, fevers, chills, night sweats, weight loss, swollen lymph nodes, body aches, joint swelling, muscle aches, chest pain, shortness of breath, mood changes.   Objective Blood pressure 138/80, pulse 83, height 6' (1.829 m), weight 250 lb (113.399 kg), SpO2 98 %.  General: No apparent distress alert and oriented x3 mood and affect normal, dressed appropriately.  HEENT: Pupils equal, extraocular movements intact  Respiratory: Patient's  speak in full sentences and does not appear short of breath  Cardiovascular: No lower extremity edema, non tender, no erythema  Skin: Warm dry intact with no signs of infection or rash on extremities or on axial skeleton.  Abdomen: Soft nontender  Neuro: Cranial nerves II through XII are intact, neurovascularly intact in all extremities with 2+ DTRs and 2+ pulses.  Lymph: No lymphadenopathy of posterior or anterior cervical chain or axillae bilaterally.  Gait normal with good balance and coordination.  MSK:  Non tender with full range of motion and good stability and symmetric strength and tone of shoulders, elbows, wrist, hip, knee bilaterally.  Ankle:right ankle Nodule present but minimal compared to previous exams. Nontender on exam with full range of motion Strength is 5/5 in all directions. Stable lateral and medial ligaments; squeeze test and kleiger test unremarkable; Talar dome nontender; No pain at base of 5th MT; No tenderness over cuboid; No tenderness over N spot or navicular prominence No tenderness on posterior aspects of lateral and medial malleolus No sign of peroneal tendon subluxations or tenderness to palpation Negative tarsal tunnel tinel's Able to walk 4 steps. Negative Thompson test  Foot exam shows the patient does have severe pes planus bilaterally with mild over pronation of the hindfoot  MSK US performed of: right This study was ordered, performed, and interpreted by Terrilee Files D.O.  Foot/Ankle:   All structures visualized.   Talar dome unremarkable  Ankle mortise without effusion. Peroneus longus and brevis tendons unremarkable on long and transverse views  without sheath effusions. Posterior tibialis, flexor hallucis longus, and flexor digitorum longus tendons unremarkable on long and transverse views without sheath effusions. Achilles tendon visualized along length of tendon patient patient's nodule is tingling to improve with very minimal enlargement from  baseline. Anterior Talofibular Ligament and Calcaneofibular Ligaments unremarkable and intact. Deltoid Ligament unremarkable and intact. Plantar fascia intact and without effusion, normal thickness. No increased doppler signal, cap sign, or thickening of tibial cortex. Power doppler signal normal.  IMPRESSION:  Improved Achilles tendinosis    Impression and Recommendations:     This case required medical decision making of moderate complexity.

## 2015-01-28 NOTE — Patient Instructions (Signed)
Great to see you You are doing great No restrictions starting in 1 week Continue the heel lift for another week or 2 Increase weight 15% a week until at your 100% If worsening symptoms take the medicine daily for 3 days then as needed See me again if any worsening symptoms Happy New Year!

## 2015-01-28 NOTE — Patient Instructions (Signed)
Get xray done  Take anti-inflammatory as needed and muscle relaxer  F/U as needed

## 2015-01-28 NOTE — Progress Notes (Signed)
Pre visit review using our clinic review tool, if applicable. No additional management support is needed unless otherwise documented below in the visit note. 

## 2015-01-28 NOTE — Assessment & Plan Note (Signed)
Significant improvement from previous exam. Discussed icing regimen and home exercises. Patient is coming continue to increase his activity as tolerated. Patient will follow-up again in 6 weeks if having any pain whatsoever.

## 2015-02-06 ENCOUNTER — Ambulatory Visit
Admission: RE | Admit: 2015-02-06 | Discharge: 2015-02-06 | Disposition: A | Discharge status: Home / Self Care | Payer: 59 | Source: Ambulatory Visit | Attending: Family Medicine | Admitting: Family Medicine

## 2015-02-06 DIAGNOSIS — M545 Low back pain, unspecified: Secondary | ICD-10-CM

## 2015-02-06 DIAGNOSIS — G8929 Other chronic pain: Secondary | ICD-10-CM

## 2015-03-25 ENCOUNTER — Encounter: Payer: Self-pay | Admitting: Family Medicine

## 2015-03-25 ENCOUNTER — Ambulatory Visit (INDEPENDENT_AMBULATORY_CARE_PROVIDER_SITE_OTHER): Payer: 59 | Admitting: Family Medicine

## 2015-03-25 VITALS — BP 122/64 | HR 82 | Temp 97.6°F | Resp 16 | Ht 72.0 in | Wt 244.0 lb

## 2015-03-25 DIAGNOSIS — J0100 Acute maxillary sinusitis, unspecified: Secondary | ICD-10-CM | POA: Diagnosis not present

## 2015-03-25 DIAGNOSIS — J309 Allergic rhinitis, unspecified: Secondary | ICD-10-CM | POA: Diagnosis not present

## 2015-03-25 MED ORDER — FLUTICASONE PROPIONATE 50 MCG/ACT NA SUSP: 2.0000 | Freq: Every day | NASAL | Status: DC

## 2015-03-25 MED ORDER — METHYLPREDNISOLONE ACETATE 40 MG/ML IJ SUSP
40.0000 mg | Freq: Once | INTRAMUSCULAR | Status: AC
  Administered 2015-03-25: 40 mg via INTRAMUSCULAR

## 2015-03-25 MED ORDER — AMOXICILLIN 875 MG PO TABS: 875.0000 mg | ORAL_TABLET | Freq: Two times a day (BID) | ORAL | Status: DC

## 2015-03-25 MED ORDER — CETIRIZINE HCL 10 MG PO TABS: 10.0000 mg | ORAL_TABLET | Freq: Every day | ORAL | Status: DC

## 2015-03-25 NOTE — Patient Instructions (Signed)
Sudafed- Ask pharmacist - take for 5 days  Take antibiotics as prescribed Take zyrtec  Flonase as prescribed Steroid shot given

## 2015-03-25 NOTE — Progress Notes (Signed)
Patient ID: Mark Jarvis, male   DOB: December 10, 1980, 36 y.o.   MRN: [id number]   Subjective:    Patient ID: Mark Jarvis, male    DOB: 02-08-1980, 35 y.o.   MRN: [id number]  Patient presents for Illness  patient here with worsening allergies. He also now has sinus pressure and pain in the spring going on for the past 2 weeks or more. He been taking Allegra occasional use of Flonase. He has known seasonal allergies and rhinitis. He has not had any fever but has had headache as well as some pain into his gum area. He also noticed a change in his mucus. He has not had any significant cough no fever.    Review Of Systems:  GEN- denies fatigue, fever, weight loss,weakness, recent illness HEENT- denies eye drainage, change in vision,+ nasal discharge, CVS- denies chest pain, palpitations RESP- denies SOB, cough, wheeze Neuro- + headache, dizziness, syncope, seizure activity       Objective:    BP 122/64 mmHg  Pulse 82  Temp(Src) 97.6 F (36.4 C) (Rectal)  Resp 16  Ht 6' (1.829 m)  Wt 244 lb (110.678 kg)  BMI 33.09 kg/m2 GEN- NAD, alert and oriented x3 HEENT- PERRL, EOMI, non injected sclera, pink conjunctiva, MMM, oropharynx mild injection, TM clear bilat no effusion,  + maxillary sinus tenderness, inflammed turbinates,  Nasal drainage , swelling over sinus region  Neck- Supple, shotty ant LAD CVS- RRR, no murmur RESP-CTAB EXT- No edema Pulses- Radial 2+        Assessment & Plan:      Problem List Items Addressed This Visit    Allergic rhinitis - Primary   Relevant Medications   methylPREDNISolone acetate (DEPO-MEDROL) injection 40 mg (Completed)    Other Visit Diagnoses    Acute maxillary sinusitis, recurrence not specified        Rhinitis with overlying sinutisis, treat with amox, zyrtec works better for him, flonasae, Water engineer with Sudafed    Relevant Medications    cetirizine (ZYRTEC) 10 MG tablet    amoxicillin (AMOXIL) 875 MG tablet    fluticasone (FLONASE)  50 MCG/ACT nasal spray    methylPREDNISolone acetate (DEPO-MEDROL) injection 40 mg (Completed)       Note: This dictation was prepared with Dragon dictation along with smaller phrase technology. Any transcriptional errors that result from this process are unintentional.

## 2015-07-07 ENCOUNTER — Ambulatory Visit (INDEPENDENT_AMBULATORY_CARE_PROVIDER_SITE_OTHER): Payer: 59 | Admitting: Family Medicine

## 2015-07-07 ENCOUNTER — Encounter: Payer: Self-pay | Admitting: Family Medicine

## 2015-07-07 VITALS — BP 122/62 | HR 68 | Temp 98.3°F | Resp 16 | Ht 72.0 in | Wt 250.0 lb

## 2015-07-07 DIAGNOSIS — S76111A Strain of right quadriceps muscle, fascia and tendon, initial encounter: Secondary | ICD-10-CM | POA: Diagnosis not present

## 2015-07-07 MED ORDER — DICLOFENAC SODIUM 75 MG PO TBEC: 75.0000 mg | DELAYED_RELEASE_TABLET | Freq: Two times a day (BID) | ORAL | Status: DC

## 2015-07-07 MED ORDER — HYDROCODONE-ACETAMINOPHEN 5-325 MG PO TABS: 1.0000 | ORAL_TABLET | Freq: Four times a day (QID) | ORAL | Status: DC | PRN

## 2015-07-07 NOTE — Patient Instructions (Signed)
MRI to be done Take diclofenac twice a day  ICE your thigh  F/U pending results

## 2015-07-08 NOTE — Progress Notes (Signed)
Patient ID: Mark ShownDarien K Dilworth, male   DOB: 31-Mar-1980, 35 y.o.   MRN: [id number]    Subjective:    Patient ID: Mark Jarvis, male    DOB: 31-Mar-1980, 35 y.o.   MRN: [id number]  Patient presents for R Upper Thigh Pain  Patient presents with injury to his right thigh. He was in his physical training for the [employer] he was playing kickball when he went to kick the ball he felt a pop had some pain directly afterwards. He did not notice any significant swelling at that time. 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 Yesterday he made a sudden movement he felt another pop and now has significant bulge in the middle of his thigh which is very painful. He has difficulty ambulating. He actually has history of quads rupture when he was a teenager. He did not require any surgery at that time. He took a couple doses of ibuprofen which has helped minimally. He denies any pain in his back or his knee everything is isolated in the thigh and up into the groin.    Review Of Systems:  GEN- denies fatigue, fever, weight loss,weakness, recent illness HEENT- denies eye drainage, change in vision, nasal discharge, CVS- denies chest pain, palpitations RESP- denies SOB, cough, wheeze ABD- denies N/V, change in stools, abd pain GU- denies dysuria, hematuria, dribbling, incontinence MSK- denies joint pain, +muscle aches, injury Neuro- denies headache, dizziness, syncope, seizure activity       Objective:    BP 122/62 mmHg  Pulse 68  Temp(Src) 98.3 F (36.8 C) (Oral)  Resp 16  Ht 6' (1.829 m)  Wt 250 lb (113.399 kg)  BMI 33.90 kg/m2 GEN- NAD, alert and oriented x3 MSK- Spine- NT, good ROM, Hips pain with IR, otherwise normal ROM, normal ROM knee  TTP Right groin significant bulge at center of right thigh, TTP, mild bruising, antalgic gait, decreased strength right upper leg compared to left  EXT- No edema Pulses- Radial, DP- 2+        Assessment & Plan:       Problem List Items Addressed This Visit    None    Visit Diagnoses    Quadriceps muscle rupture, right, initial encounter    -  Primary    based on exam concern for quads rupture or rectus femoris rupture/injury based on mechanism, given notes for work, ICE, elevate, start diclofenac, norco at bedtime, MRI to be done, ortho referral        Note: This dictation was prepared with Dragon dictation along with smaller Lobbyistphrase technology. Any transcriptional errors that result from this process are unintentional.

## 2015-07-09 ENCOUNTER — Ambulatory Visit
Admission: RE | Admit: 2015-07-09 | Discharge: 2015-07-09 | Disposition: A | Discharge status: Home / Self Care | Payer: 59 | Source: Ambulatory Visit | Attending: Family Medicine | Admitting: Family Medicine

## 2015-07-09 ENCOUNTER — Telehealth: Payer: Self-pay | Admitting: Family Medicine

## 2015-07-09 ENCOUNTER — Other Ambulatory Visit: Payer: Self-pay | Admitting: Family Medicine

## 2015-07-09 DIAGNOSIS — S76811A Strain of other specified muscles, fascia and tendons at thigh level, right thigh, initial encounter: Secondary | ICD-10-CM

## 2015-07-09 DIAGNOSIS — S76111A Strain of right quadriceps muscle, fascia and tendon, initial encounter: Secondary | ICD-10-CM

## 2015-07-09 NOTE — Telephone Encounter (Signed)
Please mail pt copy of his MRI  I spoke with pt, and [facility] Dr. Neldon NewportMuprhy who agrees to see pt, to determine if surgical needed or just PT   Will place referral for ortho consult

## 2015-07-09 NOTE — Telephone Encounter (Signed)
Records faxed to [facility]

## 2015-07-11 ENCOUNTER — Telehealth: Payer: Self-pay | Admitting: Family Medicine

## 2015-07-11 ENCOUNTER — Encounter: Payer: Self-pay | Admitting: Family Medicine

## 2015-07-11 NOTE — Telephone Encounter (Signed)
Pt out of work Sat-Sun-Mon-Tues.  Gave him work note from 07/08/15 thru 07/16/15 when he sees Ortho.

## 2015-07-11 NOTE — Telephone Encounter (Signed)
Agree with above 

## 2015-07-11 NOTE — Telephone Encounter (Signed)
Patient called requesting a work note for 07/07/15-07/14/15. He states he was unable to work Tuesday he said that you have given him a note for light duty but is now requesting a note for this week.  CB#[phone]

## 2016-01-14 ENCOUNTER — Encounter: Payer: Self-pay | Admitting: Family Medicine

## 2016-01-14 ENCOUNTER — Encounter: Payer: Self-pay | Admitting: Physician Assistant

## 2016-01-14 ENCOUNTER — Ambulatory Visit (INDEPENDENT_AMBULATORY_CARE_PROVIDER_SITE_OTHER): Payer: 59 | Admitting: Physician Assistant

## 2016-01-14 VITALS — BP 122/78 | HR 82 | Temp 98.0°F | Resp 16 | Wt 248.0 lb

## 2016-01-14 DIAGNOSIS — Z0000 Encounter for general adult medical examination without abnormal findings: Secondary | ICD-10-CM

## 2016-01-14 DIAGNOSIS — Z113 Encounter for screening for infections with a predominantly sexual mode of transmission: Secondary | ICD-10-CM

## 2016-01-14 NOTE — Progress Notes (Signed)
Patient ID: Mark Jarvis MRN: [id number], DOB: [birth year] 35 y.o. Date of Encounter: 01/14/2016, 10:48 AM    Chief Complaint: Physical (CPE)  HPI: 35 y.o. y/o male here for CPE.   He does request to do STD screening. He is having no symptoms just wants to do screening. He has no other concerns to discuss today.  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 Continues to be in the [employer].  Review of Systems: Consitutional: No fever, chills, fatigue, night sweats, lymphadenopathy, or weight changes. Eyes: No visual changes, eye redness, or discharge. ENT/Mouth: Ears: No otalgia, tinnitus, hearing loss, discharge. Nose: No congestion, rhinorrhea, sinus pain, or epistaxis. Throat: No sore throat, post nasal drip, or teeth pain. Cardiovascular: No CP, palpitations, diaphoresis, DOE, edema, orthopnea, PND. Respiratory: No cough, hemoptysis, SOB, or wheezing. Gastrointestinal: No anorexia, dysphagia, reflux, pain, nausea, vomiting, hematemesis, diarrhea, constipation, BRBPR, or melena. Genitourinary: No dysuria, frequency, urgency, hematuria, incontinence, nocturia, decreased urinary stream, discharge, impotence, or testicular pain/masses. Musculoskeletal: No decreased ROM, myalgias, stiffness, joint swelling, or weakness. Skin: No rash, erythema, lesion changes, pain, warmth, jaundice, or pruritis. Neurological: No headache, dizziness, syncope, seizures, tremors, memory loss, coordination problems, or paresthesias. Psychological: No anxiety, depression, hallucinations, SI/HI. Endocrine: No fatigue, polydipsia, polyphagia, polyuria, or known diabetes. All other systems were reviewed and are otherwise negative.  Past Medical History:  Diagnosis Date  . Allergic rhinitis   . Allergy    tree pollens,dust mites,cats,grasses  . HSV-1 infection   . Multiple food allergies 11/16/2012     No past surgical history on file.  Home Meds:    Outpatient Medications Prior to Visit  Medication Sig Dispense Refill  . cetirizine (ZYRTEC) 10 MG tablet Take 1 tablet (10 mg total) by mouth daily. 30 tablet 11  . EPINEPHrine (EPI-PEN) 0.3 mg/0.3 mL SOAJ injection Inject 0.3 mg into the muscle once.    . fluticasone (FLONASE) 50 MCG/ACT nasal spray Place 2 sprays into both nostrils daily. Reported on 03/25/2015 16 g 3  . HYDROcodone-acetaminophen (NORCO) 5-325 MG tablet Take 1 tablet by mouth every 6 (six) hours as needed for moderate pain. 30 tablet 0  . diclofenac (VOLTAREN) 75 MG EC tablet Take 1 tablet (75 mg total) by mouth 2 (two) times daily. (Patient not taking: Reported on 01/14/2016) 60 tablet 1   No facility-administered medications prior to visit.     Allergies: No Known Allergies  Social History   Social History  . Marital status: Single    Spouse name: N/A  . Number of children: N/A  . Years of education: N/A   Occupational History  . [place] [employer]   . [employer]    Social History Main Topics  . Smoking status: Never Smoker  . Smokeless tobacco: Never Used  . Alcohol use No  . Drug use: No  . Sexual activity: Not on file   Other Topics Concern  . Not on file   Social History Narrative   [place] [employer]   [employer]   Exercises: Mon/ Wed/Fri--Lifts Weights                    Tues/ Thurs--Runs 1.5 miles then leg exercises.      Single. Lives alone.    No family history on file.  Physical Exam: Blood pressure 122/78, pulse 82, temperature 98 F (36.7 C), temperature source Oral, resp. rate 16, weight 248 lb (  112.5 kg), SpO2 98 %.  General: Well developed, well nourished, AAM. Appears in no acute distress. HEENT: Normocephalic, atraumatic. Conjunctiva pink, sclera non-icteric. Pupils 2 mm constricting to 1 mm, round, regular, and equally reactive to light and accomodation. EOMI. Internal auditory canal clear. TMs with good cone of  light and without pathology. Nasal mucosa pink. Nares are without discharge. No sinus tenderness. Oral mucosa pink. Dentition good. Pharynx without exudate.   Neck: Supple. Trachea midline. No thyromegaly. Full ROM. No lymphadenopathy. Lungs: Clear to auscultation bilaterally without wheezes, rales, or rhonchi. Breathing is of normal effort and unlabored. Cardiovascular: RRR with S1 S2. No murmurs, rubs, or gallops. Distal pulses 2+ symmetrically. No carotid or abdominal bruits. Abdomen: Soft, non-tender, non-distended with normoactive bowel sounds. No hepatosplenomegaly or masses. No rebound/guarding. No CVA tenderness. No hernias. Musculoskeletal: Full range of motion and 5/5 strength throughout. Without swelling. Skin: Warm and moist without erythema, ecchymosis, wounds, or rash. Neuro: A+Ox3. CN II-XII grossly intact. Moves all extremities spontaneously. Full sensation throughout. Normal gait. DTR 2+ throughout upper and lower extremities.  Psych:  Responds to questions appropriately with a normal affect.   Assessment/Plan:  35 y.o. y/o  AA  male here for CPE  -1. Encounter for preventive health examination A. Screening Labs: He is not fasting today. However he can return Friday morning fasting for labs. - CBC with Differential/Platelet; Future - COMPLETE METABOLIC PANEL WITH GFR; Future - Lipid panel; Future - TSH; Future  B. Screening For Prostate Cancer: He has no indication to require this until age 35 (African American)  C. Screening For Colorectal Cancer:  He has no indication to require this until age 35  D. Immunizations:  States that he gets all immunizations through [employer] and they keep him up-to-date on these.           Received The flu vaccine 10/2015 and knows that he got the tetanus shot either 2015 or 2016. Flu Tetanus Pneumococcal Zostavax   2. Screening for STDs (sexually transmitted diseases) Check these when we do his labs Friday. He is to drink water  so he can labor urine sample at that time for the gonorrhea chlamydia test. - GC/Chlamydia Probe Amp; Future - Hepatitis panel, acute; Future - HIV antibody; Future - HSV(herpes smplx)abs-1+2(IgG+IgM)-bld; Future - RPR; Future     Signed:   [address] Beth [place], [place]  01/14/2016 10:48 AM

## 2016-01-16 ENCOUNTER — Other Ambulatory Visit: Payer: 59

## 2016-01-16 DIAGNOSIS — Z113 Encounter for screening for infections with a predominantly sexual mode of transmission: Secondary | ICD-10-CM

## 2016-01-16 DIAGNOSIS — Z0000 Encounter for general adult medical examination without abnormal findings: Secondary | ICD-10-CM

## 2016-01-16 LAB — CBC WITH DIFFERENTIAL/PLATELET
Basophils Absolute: 0 cells/uL (ref 0–200)
Basophils Relative: 0 %
EOS ABS: 132 {cells}/uL (ref 15–500)
Eosinophils Relative: 3 %
HEMATOCRIT: 44.6 % (ref 38.5–50.0)
Hemoglobin: 14.9 g/dL (ref 13.0–17.0)
LYMPHS PCT: 35 %
Lymphs Abs: 1540 cells/uL (ref 850–3900)
MCH: 27.5 pg (ref 27.0–33.0)
MCHC: 33.4 g/dL (ref 32.0–36.0)
MCV: 82.3 fL (ref 80.0–100.0)
MONO ABS: 440 {cells}/uL (ref 200–950)
MONOS PCT: 10 %
MPV: 10 fL (ref 7.5–12.5)
NEUTROS PCT: 52 %
Neutro Abs: 2288 cells/uL (ref 1500–7800)
Platelets: 248 10*3/uL (ref 140–400)
RBC: 5.42 MIL/uL (ref 4.20–5.80)
RDW: 14.5 % (ref 11.0–15.0)
WBC: 4.4 10*3/uL (ref 3.8–10.8)

## 2016-01-16 LAB — TSH: TSH: 1.09 mIU/L (ref 0.40–4.50)

## 2016-01-17 LAB — COMPLETE METABOLIC PANEL WITH GFR
ALT: 29 U/L (ref 9–46)
AST: 26 U/L (ref 10–40)
Albumin: 4.1 g/dL (ref 3.6–5.1)
Alkaline Phosphatase: 52 U/L (ref 40–115)
BUN: 13 mg/dL (ref 7–25)
CALCIUM: 9.4 mg/dL (ref 8.6–10.3)
CHLORIDE: 107 mmol/L (ref 98–110)
CO2: 20 mmol/L (ref 20–31)
CREATININE: 0.94 mg/dL (ref 0.60–1.35)
Glucose, Bld: 86 mg/dL (ref 70–99)
POTASSIUM: 4.4 mmol/L (ref 3.5–5.3)
Sodium: 142 mmol/L (ref 135–146)
Total Bilirubin: 0.4 mg/dL (ref 0.2–1.2)
Total Protein: 7.2 g/dL (ref 6.1–8.1)

## 2016-01-17 LAB — LIPID PANEL
CHOLESTEROL: 204 mg/dL — AB (ref <200)
HDL: 42 mg/dL (ref >40)
LDL Cholesterol: 125 mg/dL — ABNORMAL HIGH (ref <100)
TRIGLYCERIDES: 185 mg/dL — AB (ref <150)
Total CHOL/HDL Ratio: 4.9 Ratio (ref <5.0)
VLDL: 37 mg/dL — AB (ref <30)

## 2016-01-17 LAB — HIV ANTIBODY (ROUTINE TESTING W REFLEX): HIV: NONREACTIVE

## 2016-01-17 LAB — HEPATITIS PANEL, ACUTE
HCV AB: NEGATIVE
Hep A IgM: NONREACTIVE
Hep B C IgM: NONREACTIVE
Hepatitis B Surface Ag: NEGATIVE

## 2016-01-17 LAB — GC/CHLAMYDIA PROBE AMP
CT Probe RNA: NOT DETECTED
GC PROBE AMP APTIMA: NOT DETECTED

## 2016-01-17 LAB — RPR

## 2016-02-18 ENCOUNTER — Encounter: Payer: 59 | Admitting: Family Medicine

## 2016-04-12 ENCOUNTER — Encounter: Payer: Self-pay | Admitting: Family Medicine

## 2016-04-12 ENCOUNTER — Ambulatory Visit (INDEPENDENT_AMBULATORY_CARE_PROVIDER_SITE_OTHER): Payer: 59 | Admitting: Family Medicine

## 2016-04-12 VITALS — BP 110/60 | HR 86 | Temp 98.7°F | Resp 16 | Ht 72.0 in | Wt 253.0 lb

## 2016-04-12 DIAGNOSIS — G8929 Other chronic pain: Secondary | ICD-10-CM

## 2016-04-12 DIAGNOSIS — M6788 Other specified disorders of synovium and tendon, other site: Secondary | ICD-10-CM

## 2016-04-12 DIAGNOSIS — M722 Plantar fascial fibromatosis: Secondary | ICD-10-CM

## 2016-04-12 DIAGNOSIS — J3089 Other allergic rhinitis: Secondary | ICD-10-CM | POA: Diagnosis not present

## 2016-04-12 DIAGNOSIS — M7660 Achilles tendinitis, unspecified leg: Secondary | ICD-10-CM | POA: Diagnosis not present

## 2016-04-12 DIAGNOSIS — M5442 Lumbago with sciatica, left side: Secondary | ICD-10-CM

## 2016-04-12 MED ORDER — LEVOCETIRIZINE DIHYDROCHLORIDE 5 MG PO TABS: 5.0000 mg | ORAL_TABLET | Freq: Every evening | ORAL | 6 refills | Status: DC

## 2016-04-12 MED ORDER — METHOCARBAMOL 500 MG PO TABS: 500.0000 mg | ORAL_TABLET | Freq: Four times a day (QID) | ORAL | 0 refills | Status: DC

## 2016-04-12 MED ORDER — HYDROCODONE-ACETAMINOPHEN 5-325 MG PO TABS: 1.0000 | ORAL_TABLET | Freq: Four times a day (QID) | ORAL | 0 refills | Status: DC | PRN

## 2016-04-12 MED ORDER — IPRATROPIUM BROMIDE 0.03 % NA SOLN: 2.0000 | Freq: Two times a day (BID) | NASAL | 6 refills | Status: DC

## 2016-04-12 MED ORDER — METHYLPREDNISOLONE 4 MG PO TBPK: ORAL_TABLET | ORAL | 0 refills | Status: DC

## 2016-04-12 NOTE — Assessment & Plan Note (Signed)
Trial of xyzal, atrovent nasal spray

## 2016-04-12 NOTE — Assessment & Plan Note (Signed)
He has symptoms of plantar fasciitis in his artery and to have tendinitis of his Achilles. Do recommend that he follow with the sports medicine physician who is on ultrasound on him in the past to monitor his tendons

## 2016-04-12 NOTE — Progress Notes (Signed)
Subjective:    Patient ID: Mark Jarvis, male    DOB: 01/07/1981, 36 y.o.   MRN: [id number]  Patient presents for B Lumbar Back Pain (intermittent back pain with radiation down leg) and Seasonal Allergies (states that he has constant nasal drainage and allergy sx)   Pt here with chronic back pain flare, intermittant pain down right leg, with numbness and tingling , increasing episodes of pain.  He has been treated for back pain pain by our office a few times over the past few years. 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 , uses motrin as needed,has also been on flexeril in the past   No change in bowel or bladder   Xray showed mild right sided curvature of lumbar spine but otherwise normal in Jan 2017   allergies- nasal drainage, itchy  - using  zyrtec and flonase  But, keeping symptoms all year round.He was exposed to some burning chemicals when he was deployed      He also noticed he gets some bilateral foot pain near his heels and into the mid part of his feet when he runs he's also had some mild discomfort near his acuities was not sure if he needs to follow-up with his sports medicine physician  Review Of Systems:  GEN- denies fatigue, fever, weight loss,weakness, recent illness HEENT- denies eye drainage, change in vision, +nasal discharge, CVS- denies chest pain, palpitations RESP- denies SOB, cough, wheeze ABD- denies N/V, change in stools, abd pain GU- denies dysuria, hematuria, dribbling, incontinence MSK- + joint pain, muscle aches, injury Neuro- denies headache, dizziness, syncope, seizure activity       Objective:    BP 110/60   Pulse 86   Temp 98.7 F (37.1 C) (Oral)   Resp 16   Ht 6' (1.829 m)   Wt 253 lb (114.8 kg)   SpO2 98%   BMI 34.31 kg/m  GEN- NAD, alert and oriented x3 HEENT- PERRL, EOMI, non injected sclera, pink conjunctiva, MMM, oropharynx clear, + enlarged turbinates, + clear rhinorrhea, no maxillary sinus tenderness  Neck-  Supple, nno LAD  CVS- RRR, no murmur RESP-CTAB MSK - TTP midline lumbar spine and left paraspinals, mild spasm, +SLR Left side Neuro antalgic gait, DTR symmetric, sensation in tact, motor equal bilat  Feet- mild TTP plantar insertion, small nodule right achilles EXT- No edema Pulses- Radial, DP- 2+        Assessment & Plan:      Problem List Items Addressed This Visit    Allergic rhinitis    Trial of xyzal, atrovent nasal spray       Achilles tendinosis    He has symptoms of plantar fasciitis in his artery and to have tendinitis of his Achilles. Do recommend that he follow with the sports medicine physician who is on ultrasound on him in the past to monitor his tendons       Other Visit Diagnoses    Plantar fasciitis    -  Primary   Chronic midline low back pain with left-sided sciatica       Concern for progressive back pain with sciatica symptoms, he has done supportive care, home exercises, NSAIDS. Treat today with medrol dosepak, robaxin, norco, MRI to be done    Relevant Medications   methylPREDNISolone (MEDROL DOSEPAK) 4 MG TBPK tablet   HYDROcodone-acetaminophen (NORCO) 5-325 MG tablet   methocarbamol (ROBAXIN) 500 MG tablet   Other Relevant Orders   MR Lumbar Spine Wo  Contrast      Note: This dictation was prepared with Dragon dictation along with smaller phrase technology. Any transcriptional errors that result from this process are unintentional.

## 2016-04-12 NOTE — Patient Instructions (Addendum)
MRI to be done Take steroid dose pak Call Dr.Smith for your ankle/feet F/U pending results  Plantar Fasciitis Rehab Ask your health care provider which exercises are safe for you. Do exercises exactly as told by your health care provider and adjust them as directed. It is normal to feel mild stretching, pulling, tightness, or discomfort as you do these exercises, but you should stop right away if you feel sudden pain or your pain gets worse. Do not begin these exercises until told by your health care provider. Stretching and range of motion exercises These exercises warm up your muscles and joints and improve the movement and flexibility of your foot. These exercises also help to relieve pain. Exercise A: Plantar fascia stretch   1. Sit with your left / right leg crossed over your opposite knee. 2. Hold your heel with one hand with that thumb near your arch. With your other hand, hold your toes and gently pull them back toward the top of your foot. You should feel a stretch on the bottom of your toes or your foot or both. 3. Hold this stretch for__________ seconds. 4. Slowly release your toes and return to the starting position. Repeat __________ times. Complete this exercise __________ times a day. Exercise B: Gastroc, standing   1. Stand with your hands against a wall. 2. Extend your left / right leg behind you, and bend your front knee slightly. 3. Keeping your heels on the floor and keeping your back knee straight, shift your weight toward the wall without arching your back. You should feel a gentle stretch in your left / right calf. 4. Hold this position for __________ seconds. Repeat __________ times. Complete this exercise __________ times a day.  Document Released: 01/11/2005 Document Revised: 09/16/2015 Document Reviewed: 11/25/2014 Elsevier Interactive Patient Education  2017 [employer]

## 2016-04-15 ENCOUNTER — Ambulatory Visit
Admission: RE | Admit: 2016-04-15 | Discharge: 2016-04-15 | Disposition: A | Discharge status: Home / Self Care | Payer: 59 | Source: Ambulatory Visit | Attending: Family Medicine | Admitting: Family Medicine

## 2016-04-15 DIAGNOSIS — G8929 Other chronic pain: Secondary | ICD-10-CM

## 2016-04-15 DIAGNOSIS — M5442 Lumbago with sciatica, left side: Principal | ICD-10-CM

## 2016-04-21 ENCOUNTER — Other Ambulatory Visit: Payer: Self-pay | Admitting: *Deleted

## 2016-04-21 DIAGNOSIS — M5136 Other intervertebral disc degeneration, lumbar region: Secondary | ICD-10-CM

## 2016-04-21 DIAGNOSIS — M5126 Other intervertebral disc displacement, lumbar region: Secondary | ICD-10-CM

## 2016-11-30 ENCOUNTER — Encounter: Payer: Self-pay | Admitting: Family Medicine

## 2016-11-30 ENCOUNTER — Ambulatory Visit: Payer: 59 | Admitting: Family Medicine

## 2016-11-30 VITALS — BP 112/68 | HR 72 | Temp 98.4°F | Resp 14 | Ht 72.0 in | Wt 260.0 lb

## 2016-11-30 DIAGNOSIS — M7660 Achilles tendinitis, unspecified leg: Secondary | ICD-10-CM | POA: Diagnosis not present

## 2016-11-30 DIAGNOSIS — M545 Low back pain, unspecified: Secondary | ICD-10-CM | POA: Insufficient documentation

## 2016-11-30 DIAGNOSIS — M5442 Lumbago with sciatica, left side: Secondary | ICD-10-CM

## 2016-11-30 DIAGNOSIS — G8929 Other chronic pain: Secondary | ICD-10-CM

## 2016-11-30 DIAGNOSIS — M549 Dorsalgia, unspecified: Secondary | ICD-10-CM | POA: Insufficient documentation

## 2016-11-30 DIAGNOSIS — M5126 Other intervertebral disc displacement, lumbar region: Secondary | ICD-10-CM

## 2016-11-30 DIAGNOSIS — M6788 Other specified disorders of synovium and tendon, other site: Secondary | ICD-10-CM

## 2016-11-30 DIAGNOSIS — M5136 Other intervertebral disc degeneration, lumbar region: Secondary | ICD-10-CM | POA: Insufficient documentation

## 2016-11-30 MED ORDER — HYDROCODONE-ACETAMINOPHEN 5-325 MG PO TABS: 1.0000 | ORAL_TABLET | Freq: Four times a day (QID) | ORAL | 0 refills | Status: DC | PRN

## 2016-11-30 MED ORDER — LEVOCETIRIZINE DIHYDROCHLORIDE 5 MG PO TABS: 5.0000 mg | ORAL_TABLET | Freq: Every evening | ORAL | 6 refills | Status: DC

## 2016-11-30 MED ORDER — IPRATROPIUM BROMIDE 0.03 % NA SOLN: 2.0000 | Freq: Two times a day (BID) | NASAL | 6 refills | Status: DC

## 2016-11-30 NOTE — Patient Instructions (Signed)
Change physical to November 27th at 8am

## 2016-11-30 NOTE — Assessment & Plan Note (Signed)
Chronic back pain with radicular symptoms secondary to disc bulge with impingement.  He also has Achilles tendinosis which is chronic.  Participating in PT/aerobic exercise will worsen this and cause more pain.  I have written a note detailing this.  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  They will will take over his treatment in December.  I did refill his hydrocodone which he uses very sparingly and understands that this is habit forming do not use while driving or working.

## 2016-11-30 NOTE — Progress Notes (Signed)
   Subjective:    Patient ID: Mark Jarvis, male    DOB: 10/10/1980, 36 y.o.   MRN: [id number]  Patient presents for Lower Back Pain (states that he continues to have L side back pain that sometime radiates to the R side) and R Achilles Tendon Pain (states that he has increased pain in leg/ foot, especially when he is running) Needs letter for military he was here for Chronic back pain, had MRI showing disc bulge with nerve impingement on the left side.  He also has had chronic Achilles tendinosis problems.  This is flared up when he goes to PT for the [employer].  He needs letter stating this so that he does not need to participate.  He did complete physical therapy on the civilian side after he was seen by orthopedics.  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  He does use hydrocodone if the pain is severe his last prescription was from March he is about to run out now.  Otherwise he will use Tylenol or ibuprofen if needed.      Review Of Systems:  GEN- d came early to appreciate him not coming like the day before o Objective:    BP 112/68   Pulse 72   Temp 98.4 F (36.9 C) (Oral)   Resp 14   Ht 6' (1.829 m)   Wt 260 lb (117.9 kg)   SpO2 98%   BMI 35.26 kg/m  GEN- NAD, alert and oriented x3        Assessment & Plan:      Problem List Items Addressed This Visit      Unprioritized   Achilles tendinosis - Primary   L4-L5 disc bulge   Chronic bilateral low back pain with left-sided sciatica    Chronic back pain with radicular symptoms secondary to disc bulge with impingement.  He also has Achilles tendinosis which is chronic.  Participating in PT/aerobic exercise will worsen this and cause more pain.  I have written a note detailing this.  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  They will will take over his treatment in December.  I did refill his hydrocodone  which he uses very sparingly and understands that this is habit forming do not use while driving or working.      Relevant Medications   HYDROcodone-acetaminophen (NORCO) 5-325 MG tablet      Note: This dictation was prepared with Dragon dictation along with smaller phrase technology. Any transcriptional errors that result from this process are unintentional.

## 2016-12-21 ENCOUNTER — Ambulatory Visit (INDEPENDENT_AMBULATORY_CARE_PROVIDER_SITE_OTHER): Payer: 59 | Admitting: Family Medicine

## 2016-12-21 ENCOUNTER — Other Ambulatory Visit: Payer: Self-pay

## 2016-12-21 ENCOUNTER — Encounter: Payer: Self-pay | Admitting: Family Medicine

## 2016-12-21 VITALS — BP 130/72 | HR 74 | Temp 98.4°F | Resp 18 | Ht 72.0 in | Wt 257.0 lb

## 2016-12-21 DIAGNOSIS — E66811 Obesity, class 1: Secondary | ICD-10-CM | POA: Insufficient documentation

## 2016-12-21 DIAGNOSIS — Z6834 Body mass index (BMI) 34.0-34.9, adult: Secondary | ICD-10-CM | POA: Diagnosis not present

## 2016-12-21 DIAGNOSIS — Z113 Encounter for screening for infections with a predominantly sexual mode of transmission: Secondary | ICD-10-CM | POA: Diagnosis not present

## 2016-12-21 DIAGNOSIS — E669 Obesity, unspecified: Secondary | ICD-10-CM | POA: Insufficient documentation

## 2016-12-21 DIAGNOSIS — E6609 Other obesity due to excess calories: Secondary | ICD-10-CM | POA: Diagnosis not present

## 2016-12-21 DIAGNOSIS — Z0000 Encounter for general adult medical examination without abnormal findings: Secondary | ICD-10-CM

## 2016-12-21 NOTE — Patient Instructions (Signed)
I recommend eye visit once a year I recommend dental visit every 6 months Goal is to  Exercise 30 minutes 5 days a week We will call with results F/U 1 year for Physical

## 2016-12-21 NOTE — Progress Notes (Signed)
   Subjective:    Patient ID: Mark Jarvis, Mark Jarvis    DOB: 1980/09/09, 36 y.o.   MRN: [id number]  Patient presents for CPE (is fasting)  Pt here for CPE  Medications reviewed Immunizations- TDAP UTD, flu shot  With [employer]   Due for fasting labs  Chronic back pain- uses norco as needed   Mild hyperlipidemia noted on labs from Dec 2017 due for recheck  He has not been exercising due to back injury and achilles injury  Dentist every 6 months   Review Of Systems:  GEN- denies fatigue, fever, weight loss,weakness, recent illness HEENT- denies eye drainage, change in vision, nasal discharge, CVS- denies chest pain, palpitations RESP- denies SOB, cough, wheeze ABD- denies N/V, change in stools, abd pain GU- denies dysuria, hematuria, dribbling, incontinence MSK- denies joint pain, muscle aches, injury Neuro- denies headache, dizziness, syncope, seizure activity       Objective:    BP 130/72   Pulse 74   Temp 98.4 F (36.9 C) (Oral)   Resp 18   Ht 6' (1.829 m)   Wt 257 lb (116.6 kg)   SpO2 96%   BMI 34.86 kg/m  GEN- NAD, alert and oriented x3,obese HEENT- PERRL, EOMI, non injected sclera, pink conjunctiva, MMM, oropharynx clear, TM clear bilat Neck- Supple, no thyromegaly CVS- RRR, no murmur RESP-CTAB ABD-NABS,soft,NT,ND EXT- No edema Pulses- Radial, DP- 2+        Assessment & Plan:      Problem List Items Addressed This Visit      Unprioritized   Obese    Other Visit Diagnoses    Routine general medical examination at a health care facility    -  Primary   CPE done, immunizations UTD, discussed weight and diet, goal 220lbs. STD screen   Relevant Orders   CBC with Differential/Platelet   Comprehensive metabolic panel   Lipid panel   Screen for STD (sexually transmitted disease)       Relevant Orders   HIV antibody   RPR   C. trachomatis/N. gonorrhoeae RNA   HSV(herpes simplex vrs) 1+2 ab-IgG      Note: This dictation was prepared with Dragon  dictation along with smaller phrase technology. Any transcriptional errors that result from this process are unintentional.

## 2016-12-22 LAB — HSV(HERPES SIMPLEX VRS) I + II AB-IGG
HAV 1 IGG,TYPE SPECIFIC AB: 54.6 index — ABNORMAL HIGH
HSV 2 IGG,TYPE SPECIFIC AB: <0.9 index

## 2016-12-22 LAB — CBC WITH DIFFERENTIAL/PLATELET
BASOS ABS: 41 {cells}/uL (ref 0–200)
Basophils Relative: 0.9 %
EOS ABS: 162 {cells}/uL (ref 15–500)
Eosinophils Relative: 3.6 %
HCT: 44.6 % (ref 38.5–50.0)
HEMOGLOBIN: 14.9 g/dL (ref 13.2–17.1)
LYMPHS ABS: 1800 {cells}/uL (ref 850–3900)
MCH: 27.5 pg (ref 27.0–33.0)
MCHC: 33.4 g/dL (ref 32.0–36.0)
MCV: 82.3 fL (ref 80.0–100.0)
MONOS PCT: 13.2 %
MPV: 10.4 fL (ref 7.5–12.5)
NEUTROS PCT: 42.3 %
Neutro Abs: 1904 cells/uL (ref 1500–7800)
Platelets: 239 10*3/uL (ref 140–400)
RBC: 5.42 10*6/uL (ref 4.20–5.80)
RDW: 13.3 % (ref 11.0–15.0)
TOTAL LYMPHOCYTE: 40 %
WBC mixed population: 594 cells/uL (ref 200–950)
WBC: 4.5 10*3/uL (ref 3.8–10.8)

## 2016-12-22 LAB — COMPREHENSIVE METABOLIC PANEL
AG RATIO: 1.4 (calc) (ref 1.0–2.5)
ALBUMIN MSPROF: 4.2 g/dL (ref 3.6–5.1)
ALKALINE PHOSPHATASE (APISO): 53 U/L (ref 40–115)
ALT: 32 U/L (ref 9–46)
AST: 28 U/L (ref 10–40)
BILIRUBIN TOTAL: 0.6 mg/dL (ref 0.2–1.2)
BUN: 16 mg/dL (ref 7–25)
CHLORIDE: 103 mmol/L (ref 98–110)
CO2: 26 mmol/L (ref 20–32)
Calcium: 9.4 mg/dL (ref 8.6–10.3)
Creat: 1.13 mg/dL (ref 0.60–1.35)
GLOBULIN: 2.9 g/dL (ref 1.9–3.7)
Glucose, Bld: 91 mg/dL (ref 65–99)
POTASSIUM: 4.1 mmol/L (ref 3.5–5.3)
SODIUM: 138 mmol/L (ref 135–146)
TOTAL PROTEIN: 7.1 g/dL (ref 6.1–8.1)

## 2016-12-22 LAB — C. TRACHOMATIS/N. GONORRHOEAE RNA
C. TRACHOMATIS RNA, TMA: NOT DETECTED
N. GONORRHOEAE RNA, TMA: NOT DETECTED

## 2016-12-22 LAB — LIPID PANEL
Cholesterol: 240 mg/dL — ABNORMAL HIGH (ref <200)
HDL: 45 mg/dL (ref >40)
LDL Cholesterol (Calc): 164 mg/dL (calc) — ABNORMAL HIGH
Non-HDL Cholesterol (Calc): 195 mg/dL (calc) — ABNORMAL HIGH (ref <130)
Total CHOL/HDL Ratio: 5.3 (calc) — ABNORMAL HIGH (ref <5.0)
Triglycerides: 161 mg/dL — ABNORMAL HIGH (ref <150)

## 2016-12-22 LAB — RPR: RPR: NONREACTIVE

## 2016-12-22 LAB — HIV ANTIBODY (ROUTINE TESTING W REFLEX): HIV: NONREACTIVE

## 2016-12-23 ENCOUNTER — Other Ambulatory Visit: Payer: Self-pay | Admitting: *Deleted

## 2016-12-23 DIAGNOSIS — E7800 Pure hypercholesterolemia, unspecified: Secondary | ICD-10-CM

## 2017-01-21 ENCOUNTER — Encounter: Payer: 59 | Admitting: Family Medicine

## 2017-02-16 ENCOUNTER — Encounter: Payer: 59 | Admitting: Family Medicine

## 2017-03-28 ENCOUNTER — Other Ambulatory Visit: Payer: Self-pay

## 2017-10-24 IMAGING — MR MR LUMBAR SPINE W/O CM
4 of 5 series · 26 of 48 positions shown · non-contrast
Comparison: Prior radiograph from 02/06/2015.

CLINICAL DATA: Initial evaluation for chronic midline low back
pain, occasionally in bilateral thighs as well. Symptoms worsened
past few months.

EXAM:
MRI LUMBAR SPINE WITHOUT CONTRAST
TECHNIQUE: Multiplanar, multisequence MR imaging of the lumbar spine was
performed. No intravenous contrast was administered.

[Series 3: T2 · sagittal · 4.0mm · 0.55mm/px · 6 of 13 slices shown (1 of 2)]
[im 1/13]
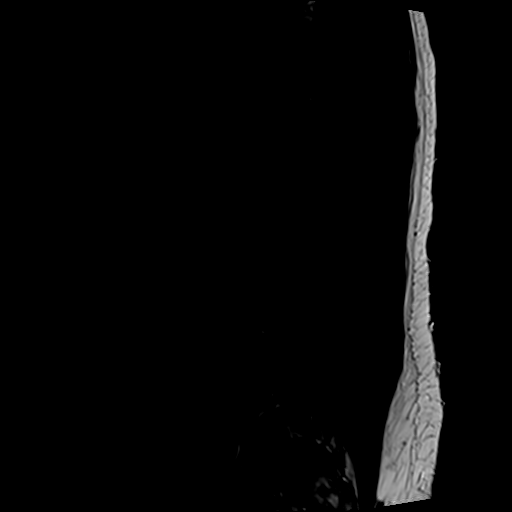
[im 3/13]
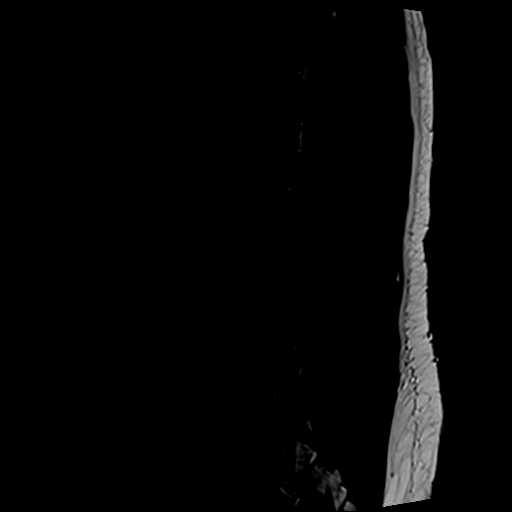
[im 5/13]
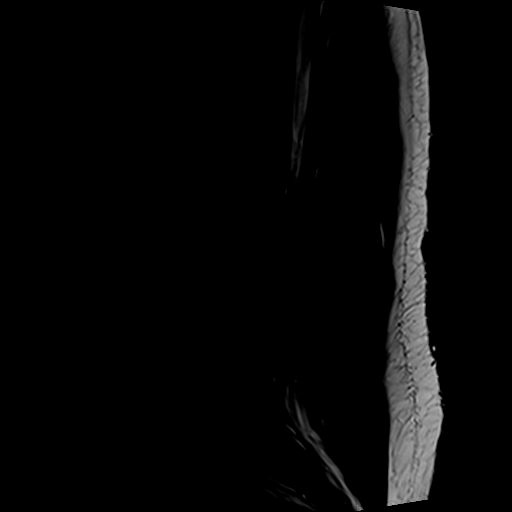
[im 8/13]
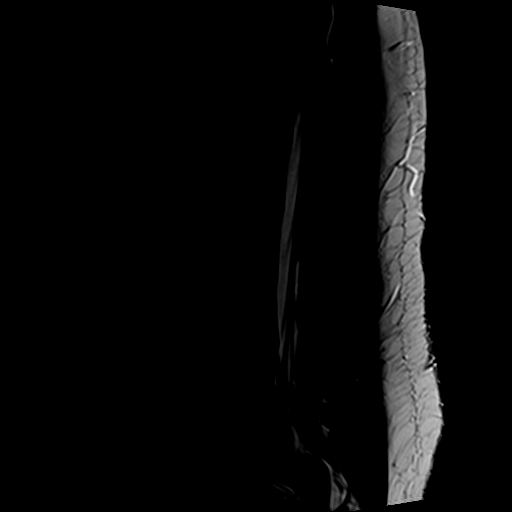
[im 10/13]
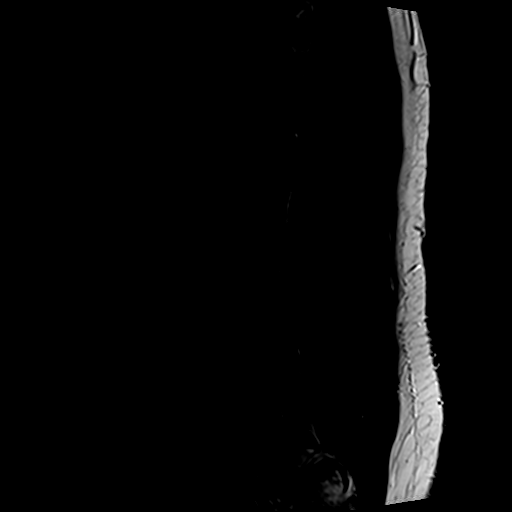
[im 13/13]
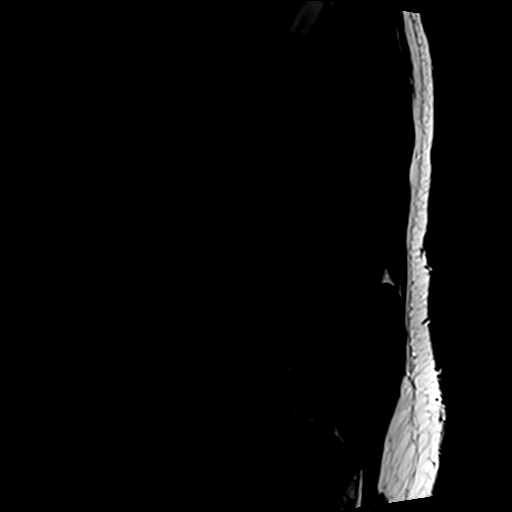

[Series 5: T1 · sagittal · 4.0mm · 0.55mm/px · 5 of 13 slices shown (1 of 2)]
[im 1/13]
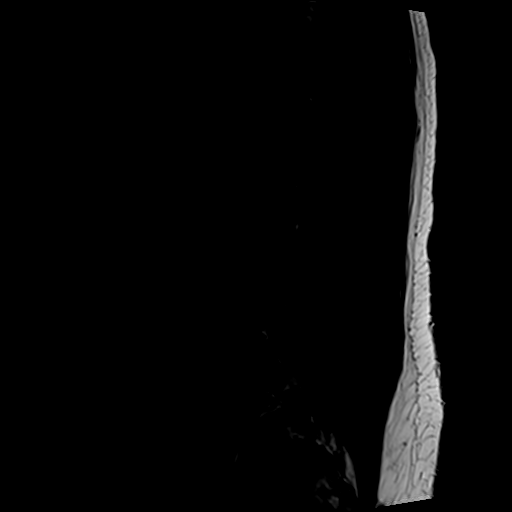
[im 4/13]
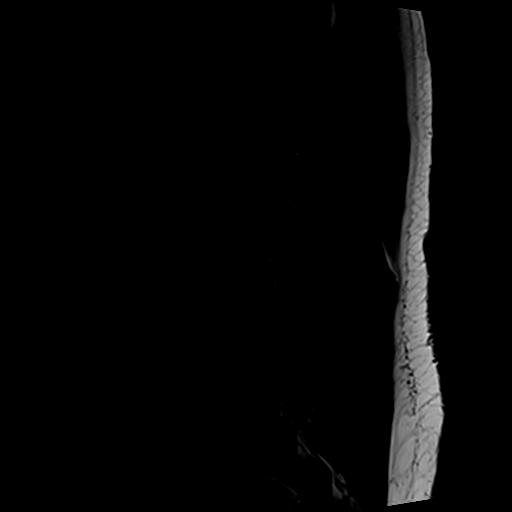
[im 7/13]
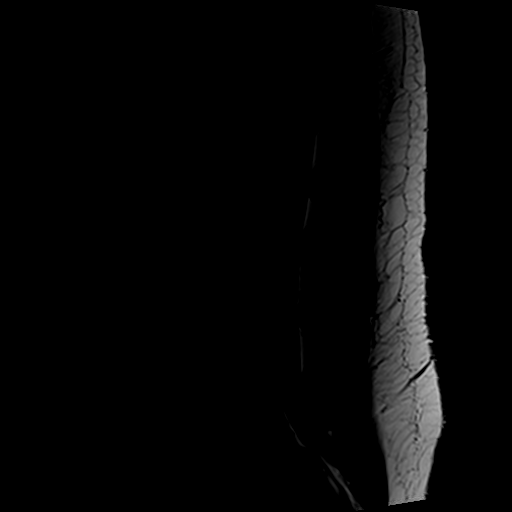
[im 10/13]
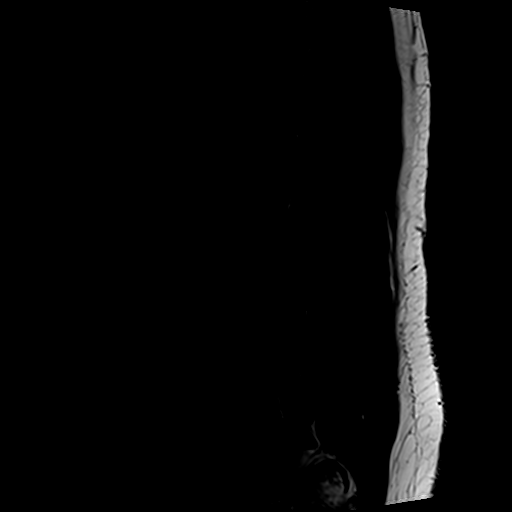
[im 13/13]
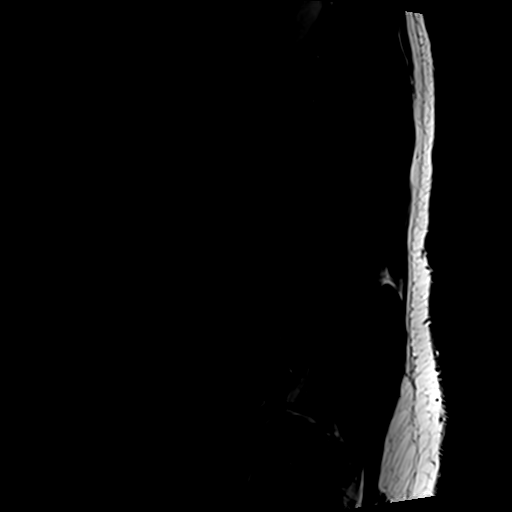

[Series 6: T1 · axial · 4.0mm · 0.35mm/px · z∈[-53,+103]mm · 5 of 38 slices shown (2 of 2)]
[im 3/38]
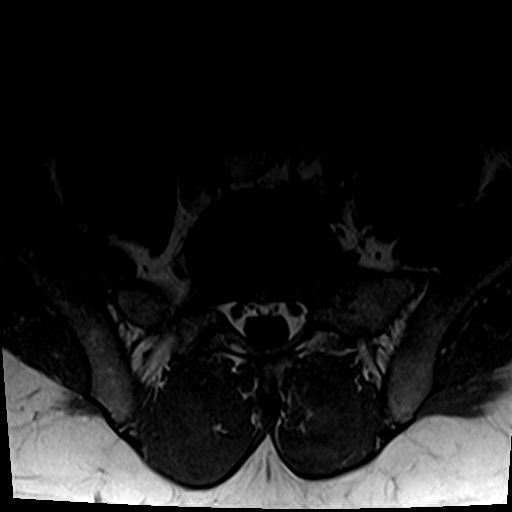
[im 5/38]
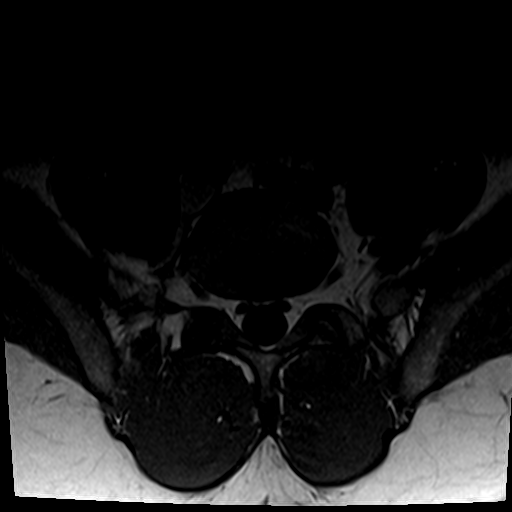
[im 8/38]
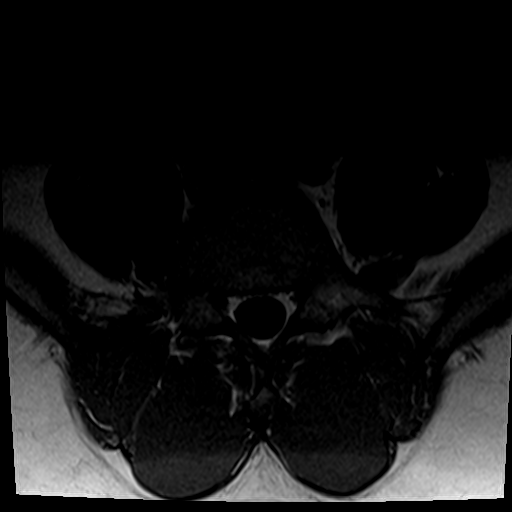
[im 20/38]
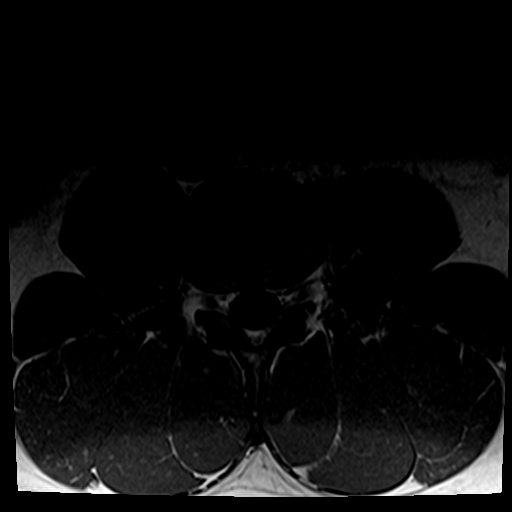
[im 33/38]
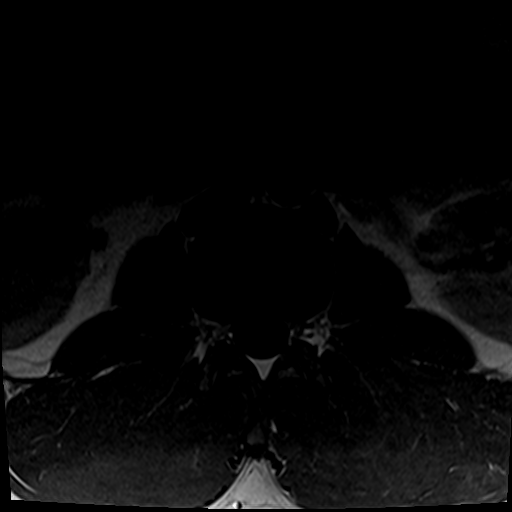

[Series 7: T2 · axial · 4.0mm · 0.70mm/px · z∈[-53,+128]mm · 10 of 38 slices shown (2 of 2)]
[im 3/38]
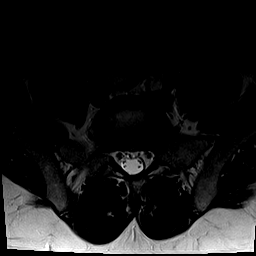
[im 5/38]
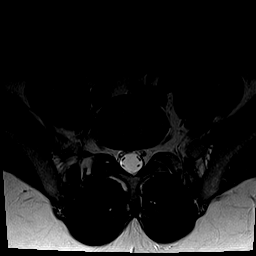
[im 8/38]
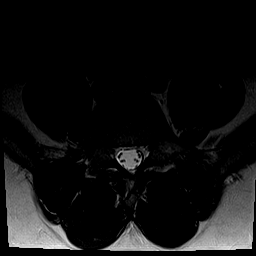
[im 13/38]
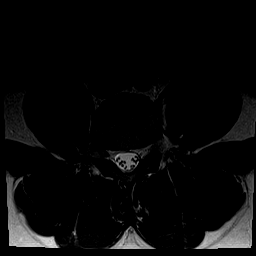
[im 18/38]
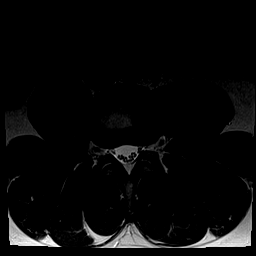
[im 20/38]
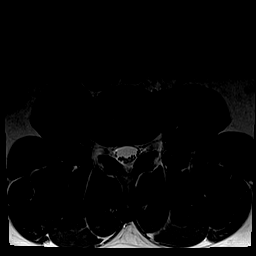
[im 23/38]
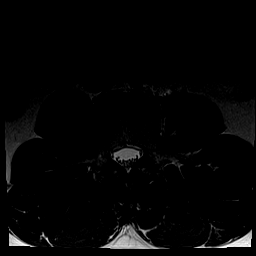
[im 28/38]
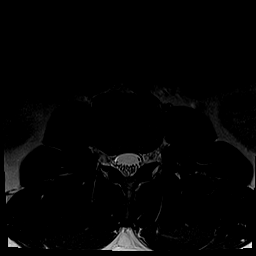
[im 33/38]
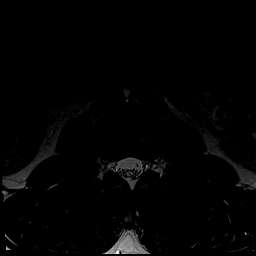
[im 38/38]
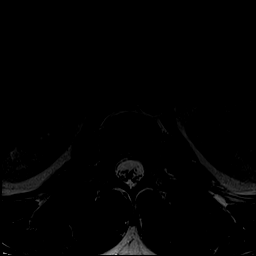

[26 of 48 positions shown; findings below may reference images not displayed]

FINDINGS: Segmentation: Normal segmentation. Lowest well-formed disc is
labeled the L5-S1 level.

Alignment: Straightening of the normal lumbar lordosis. Mild
dextroscoliosis. No listhesis.

Vertebrae: Vertebral body heights well maintained. Signal intensity
within the vertebral body bone marrow is normal. No abnormal marrow
edema. No discrete osseous lesions.

Conus medullaris: Extends to the L1 level and appears normal.

Paraspinal and other soft tissues: Paraspinous soft tissues within
normal limits. Visualized fissure structures unremarkable.

Disc levels:

L1-2:  Unremarkable.

L2-3:  Unremarkable.

L3-4:  Normal disc.  Mild bilateral facet hypertrophy.

L4-5: Mild disc desiccation. There is a broad left extraforaminal
disc protrusion with associated annular fissure (series 7, image
26). Protruding disc closely approximates the exiting left L4 nerve
root without frank neural impingement. Mild facet hypertrophy with
small reactive effusions within the bilateral L4-5 facets. No canal
stenosis. Mild left neural foraminal narrowing. No significant right
foraminal encroachment.

L5-S1:  Unremarkable.
IMPRESSION: 1. Left extraforaminal disc protrusion at L4-5, closely
approximating and potentially irritating the exiting left L4 nerve
root.
2. Mild bilateral facet hypertrophy at L3-4 and L4-5.
3. Otherwise unremarkable MRI of the lumbar spine.

## 2017-12-26 ENCOUNTER — Encounter: Payer: 59 | Admitting: Family Medicine

## 2018-01-04 ENCOUNTER — Encounter: Payer: Self-pay | Admitting: Family Medicine

## 2018-09-16 ENCOUNTER — Other Ambulatory Visit: Payer: Self-pay

## 2018-09-16 DIAGNOSIS — Z20822 Contact with and (suspected) exposure to covid-19: Secondary | ICD-10-CM

## 2018-09-17 LAB — NOVEL CORONAVIRUS, NAA: SARS-CoV-2, NAA: NOT DETECTED

## 2018-09-19 ENCOUNTER — Telehealth: Payer: Self-pay | Admitting: Family Medicine

## 2018-09-19 NOTE — Telephone Encounter (Signed)
Negative COVID results given. Patient results "NOT Detected." Caller expressed understanding. ° °

## 2018-11-02 ENCOUNTER — Encounter: Payer: Self-pay | Admitting: Adult Health

## 2018-11-02 ENCOUNTER — Ambulatory Visit: Payer: Managed Care, Other (non HMO) | Admitting: Adult Health

## 2018-11-02 VITALS — BP 118/80 | HR 65 | Temp 97.8°F | Resp 18 | Ht 74.0 in | Wt 250.0 lb

## 2018-11-02 DIAGNOSIS — Z008 Encounter for other general examination: Secondary | ICD-10-CM

## 2018-11-02 NOTE — Progress Notes (Signed)
[name]  Mark Jarvis DOB: 38 y.o. MRN: [id number]  Subjective: Allergies  Allergen Reactions  . Pollen Extract     Multiple outdoor allergies year round      Patient Active Problem List   Diagnosis Date Noted  . Obese 12/21/2016  . Chronic back pain 11/30/2016  . Chronic bilateral low back pain with left-sided sciatica 11/30/2016  . L4-L5 disc bulge 11/30/2016  . Pes planus of both feet 12/17/2014  . 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 Allergic rhinitis     Current Outpatient Medications:  .  cetirizine (ZYRTEC) 10 MG tablet, Take 10 mg by mouth daily., Disp: , Rfl:  .  EPINEPHrine (EPI-PEN) 0.3 mg/0.3 mL SOAJ injection, Inject 0.3 mg into the muscle once., Disp: , Rfl:  .  ipratropium (ATROVENT) 0.03 % nasal spray, Place 2 sprays every 12 (twelve) hours into both nostrils. As needed, Disp: 30 mL, Rfl: 6  Here for Biometric Screen/brief exam Patient is a 38 year old male in no acute distress who comes to the clinic for his biometric screening and brief exam.  He works for the [employer] of [name]  He is here for his insurance biometric so that he can receive a discount on his premium. He denies any health concerns at this time. He does have multiple outdoor allergies and has had increased lateral ear pressure over the last few weeks.  No pain.  No drainage.  No ear injuries.  He denies any decreased hearing bilaterally. He has no other concerns for this visit today he reports he is feeling well. He usually exercises daily, however he reports during this COVID-19 pandemic he has not had the motivation to do that but he has been getting back into his routine in the last week or so. Tries to eat healthy when he can, he does have room for improvement in this area he reports.  He sees his primary care provider [name], [name], MD for regularly scheduled male health maintenance and as needed he reports. Patient  denies  any fever, body aches,chills, rash, chest pain, shortness of breath, nausea, vomiting, or diarrhea.   Objective:  Today's Vitals   11/02/18 0833  BP: 118/80  Pulse: 65  Resp: 18  Temp: 97.8 F (36.6 C)  TempSrc: Temporal  SpO2: 97%  Weight: 250 lb (113.4 kg)  Height: 6\' 2"  (1.88 m)   Body mass index is 32.1 kg/m.  Patient is alert and oriented and responsive to questions Engages in eye contact with provider. Speaks in full sentences without any pauses without any shortness of breath or distress.  NAD, well-developed well-nourished HEENT: Within normal limits, no anterior posterior cervical lymphadenopathy Neck: Normal, neck supple Heart: Regular rate and rhythm without murmurs rubs or gallops Lungs: Clear to auscultation without any adventitious lung sounds Patient moves on and off of exam table and in room without difficulty. Gait is normal in hall and in room. Patient is oriented to person place time and situation. Patient answers questions appropriately and engages in conversation.   Assessment: Biometric screen Encounter for biometric screening  Encounter for other general examination-not a full annual physical-biometric screening with brief exam only    Plan: Continue your allergy medications he currently takes Zyrtec 10 mg daily as well as Atrovent nasal spray given to him by his [employer] [facility].  He also has an epinephrine pen with him for his allergies at all times. He is advised not down his allergy symptoms  for the next couple nights he could take Benadryl 25 mg at bedtime not to exceed 50 mg at bedtime, patient was cautioned that he should not drive and that it will cause drowsiness. Fasting glucose and lipids. Discussed with patient that today's visit here is a limited biometric screening visit (not a comprehensive exam or management of any chronic problems) Discussed some health issues, including healthy eating habits and exercise. Encouraged to  follow-up with PCP for annual comprehensive preventive and wellness care (and if applicable, any chronic issues). Questions invited and answered.  I will have the office call you on your glucose and cholesterol results when they return if you have not heard within 1 week please call the office.  This biometric physical is a brief physical and the only labs done are glucose and your lipid panel(cholesterol) and is  not a substitute for seeing a primary care provider for a complete annual physical. Please see a primary care physician for routine health maintenance, labs and full physical at least yearly and follow up as recommended by your provider. Provider also recommends if you do not have a primary care provider for patient to establish care as soon as possible .Patient may chose provider of choice. Also gave the [facility] at [phone] or web site at [email or web address] to help assist with finding a primary care doctor.  Patient verbalizes understanding that his office is acute care only and not a substitute for a primary care or for the management of chronic conditions.

## 2018-11-02 NOTE — Patient Instructions (Signed)
I will have the office call you on your glucose and cholesterol results when they return if you have not heard within 1 week please call the office.  This biometric physical is a brief physical and the only labs done are glucose and your lipid panel(cholesterol) and is  not a substitute for seeing a primary care provider for a complete annual physical. Please see a primary care physician for routine health maintenance, labs and full physical at least yearly and follow up as recommended by your provider. Provider also recommends if you do not have a primary care provider for patient to establish care as soon as possible .Patient may chose provider of choice. Also gave the [facility]  PHYSICIAN/PROVIDER  REFERRAL LINE at [phone] or web site at [email or web address] to help assist with finding a primary care doctor.  Patient verbalizes understanding that his office is acute care only and not a substitute for a primary care or for the management of chronic conditions.     Health Maintenance, Male Adopting a healthy lifestyle and getting preventive care are important in promoting health and wellness. Ask your health care provider about:  The right schedule for you to have regular tests and exams.  Things you can do on your own to prevent diseases and keep yourself healthy. What should I know about diet, weight, and exercise? Eat a healthy diet   Eat a diet that includes plenty of vegetables, fruits, low-fat dairy products, and lean protein.  Do not eat a lot of foods that are high in solid fats, added sugars, or sodium. Maintain a healthy weight Body mass index (BMI) is a measurement that can be used to identify possible weight problems. It estimates body fat based on height and weight. Your health care provider can help determine your BMI and help you achieve or maintain a healthy weight. Get regular exercise Get regular exercise. This is one of the most important things you can do for your  health. Most adults should:  Exercise for at least 150 minutes each week. The exercise should increase your heart rate and make you sweat (moderate-intensity exercise).  Do strengthening exercises at least twice a week. This is in addition to the moderate-intensity exercise.  Spend less time sitting. Even light physical activity can be beneficial. Watch cholesterol and blood lipids Have your blood tested for lipids and cholesterol at 36 years of age, then have this test every 5 years. You may need to have your cholesterol levels checked more often if:  Your lipid or cholesterol levels are high.  You are older than 36 years of age.  You are at high risk for heart disease. What should I know about cancer screening? Many types of cancers can be detected early and may often be prevented. Depending on your health history and family history, you may need to have cancer screening at various ages. This may include screening for:  Colorectal cancer.  Prostate cancer.  Skin cancer.  Lung cancer. What should I know about heart disease, diabetes, and high blood pressure? Blood pressure and heart disease  High blood pressure causes heart disease and increases the risk of stroke. This is more likely to develop in people who have high blood pressure readings, are of African descent, or are overweight.  Talk with your health care provider about your target blood pressure readings.  Have your blood pressure checked: ? Every 3-5 years if you are 18-39 years of age. ? Every year if you are   40 years old or older.  If you are between the ages of 65 and 75 and are a current or former smoker, ask your health care provider if you should have a one-time screening for abdominal aortic aneurysm (AAA). Diabetes Have regular diabetes screenings. This checks your fasting blood sugar level. Have the screening done:  Once every three years after age 45 if you are at a normal weight and have a low risk for  diabetes.  More often and at a younger age if you are overweight or have a high risk for diabetes. What should I know about preventing infection? Hepatitis B If you have a higher risk for hepatitis B, you should be screened for this virus. Talk with your health care provider to find out if you are at risk for hepatitis B infection. Hepatitis C Blood testing is recommended for:  Everyone born from 1945 through 1965.  Anyone with known risk factors for hepatitis C. Sexually transmitted infections (STIs)  You should be screened each year for STIs, including gonorrhea and chlamydia, if: ? You are sexually active and are younger than 36 years of age. ? You are older than 36 years of age and your health care provider tells you that you are at risk for this type of infection. ? Your sexual activity has changed since you were last screened, and you are at increased risk for chlamydia or gonorrhea. Ask your health care provider if you are at risk.  Ask your health care provider about whether you are at high risk for HIV. Your health care provider may recommend a prescription medicine to help prevent HIV infection. If you choose to take medicine to prevent HIV, you should first get tested for HIV. You should then be tested every 3 months for as long as you are taking the medicine. Follow these instructions at home: Lifestyle  Do not use any products that contain nicotine or tobacco, such as cigarettes, e-cigarettes, and chewing tobacco. If you need help quitting, ask your health care provider.  Do not use street drugs.  Do not share needles.  Ask your health care provider for help if you need support or information about quitting drugs. Alcohol use  Do not drink alcohol if your health care provider tells you not to drink.  If you drink alcohol: ? Limit how much you have to 0-2 drinks a day. ? Be aware of how much alcohol is in your drink. In the [place] one drink equals one 12 oz bottle of  beer (355 mL), one 5 oz glass of wine (148 mL), or one 1 oz glass of hard liquor (44 mL). General instructions  Schedule regular health, dental, and eye exams.  Stay current with your vaccines.  Tell your health care provider if: ? You often feel depressed. ? You have ever been abused or do not feel safe at home. Summary  Adopting a healthy lifestyle and getting preventive care are important in promoting health and wellness.  Follow your health care provider's instructions about healthy diet, exercising, and getting tested or screened for diseases.  Follow your health care provider's instructions on monitoring your cholesterol and blood pressure. This information is not intended to replace advice given to you by your health care provider. Make sure you discuss any questions you have with your health care provider. Document Released: 07/10/2007 Document Revised: 01/04/2018 Document Reviewed: 01/04/2018 Elsevier Patient Education  2020 Elsevier Inc.  

## 2018-11-03 LAB — LIPID PANEL
Chol/HDL Ratio: 5.5 ratio — ABNORMAL HIGH (ref 0.0–5.0)
Cholesterol, Total: 232 mg/dL — ABNORMAL HIGH (ref 100–199)
HDL: 42 mg/dL (ref >39)
LDL Chol Calc (NIH): 159 mg/dL — ABNORMAL HIGH (ref 0–99)
Triglycerides: 169 mg/dL — ABNORMAL HIGH (ref 0–149)
VLDL Cholesterol Cal: 31 mg/dL (ref 5–40)

## 2018-11-03 LAB — GLUCOSE, RANDOM: Glucose: 96 mg/dL (ref 65–99)

## 2018-11-06 ENCOUNTER — Other Ambulatory Visit: Payer: Self-pay | Admitting: *Deleted

## 2018-11-29 ENCOUNTER — Other Ambulatory Visit: Payer: Self-pay

## 2018-11-29 DIAGNOSIS — Z20822 Contact with and (suspected) exposure to covid-19: Secondary | ICD-10-CM

## 2018-11-30 LAB — NOVEL CORONAVIRUS, NAA: SARS-CoV-2, NAA: NOT DETECTED

## 2018-12-01 ENCOUNTER — Telehealth: Payer: Self-pay | Admitting: General Practice

## 2018-12-01 NOTE — Telephone Encounter (Signed)
Negative COVID results given. Patient results "NOT Detected." Caller expressed understanding. ° °

## 2018-12-27 ENCOUNTER — Other Ambulatory Visit: Payer: Self-pay

## 2018-12-27 DIAGNOSIS — Z20822 Contact with and (suspected) exposure to covid-19: Secondary | ICD-10-CM

## 2018-12-29 ENCOUNTER — Encounter: Payer: Self-pay | Admitting: Family Medicine

## 2018-12-29 LAB — NOVEL CORONAVIRUS, NAA: SARS-CoV-2, NAA: DETECTED — AB

## 2019-01-01 ENCOUNTER — Encounter: Payer: Self-pay | Admitting: Family Medicine

## 2019-01-02 ENCOUNTER — Encounter: Payer: Self-pay | Admitting: Family Medicine

## 2019-04-04 ENCOUNTER — Other Ambulatory Visit: Payer: Self-pay

## 2019-04-04 ENCOUNTER — Ambulatory Visit: Payer: Managed Care, Other (non HMO) | Admitting: Family Medicine

## 2019-04-04 ENCOUNTER — Encounter: Payer: Self-pay | Admitting: Family Medicine

## 2019-04-04 VITALS — BP 118/66 | HR 84 | Temp 98.0°F | Resp 16 | Ht 74.0 in | Wt 262.0 lb

## 2019-04-04 DIAGNOSIS — E669 Obesity, unspecified: Secondary | ICD-10-CM

## 2019-04-04 DIAGNOSIS — Z0000 Encounter for general adult medical examination without abnormal findings: Secondary | ICD-10-CM | POA: Diagnosis not present

## 2019-04-04 LAB — COMPREHENSIVE METABOLIC PANEL
AG Ratio: 1.6 (calc) (ref 1.0–2.5)
ALT: 51 U/L — ABNORMAL HIGH (ref 9–46)
AST: 40 U/L (ref 10–40)
Albumin: 4.4 g/dL (ref 3.6–5.1)
Alkaline phosphatase (APISO): 57 U/L (ref 36–130)
BUN: 16 mg/dL (ref 7–25)
CO2: 25 mmol/L (ref 20–32)
Calcium: 9.8 mg/dL (ref 8.6–10.3)
Chloride: 103 mmol/L (ref 98–110)
Creat: 1.12 mg/dL (ref 0.60–1.35)
Globulin: 2.8 g/dL (calc) (ref 1.9–3.7)
Glucose, Bld: 84 mg/dL (ref 65–99)
Potassium: 4.4 mmol/L (ref 3.5–5.3)
Sodium: 139 mmol/L (ref 135–146)
Total Bilirubin: 0.7 mg/dL (ref 0.2–1.2)
Total Protein: 7.2 g/dL (ref 6.1–8.1)

## 2019-04-04 LAB — LIPID PANEL
Cholesterol: 273 mg/dL — ABNORMAL HIGH (ref <200)
HDL: 47 mg/dL (ref >=40)
LDL Cholesterol (Calc): 201 mg/dL (calc) — ABNORMAL HIGH
Non-HDL Cholesterol (Calc): 226 mg/dL (calc) — ABNORMAL HIGH (ref <130)
Total CHOL/HDL Ratio: 5.8 (calc) — ABNORMAL HIGH (ref <5.0)
Triglycerides: 117 mg/dL (ref <150)

## 2019-04-04 LAB — CBC WITH DIFFERENTIAL/PLATELET
Absolute Monocytes: 448 cells/uL (ref 200–950)
Basophils Absolute: 41 cells/uL (ref 0–200)
Basophils Relative: 1.1 %
Eosinophils Absolute: 326 cells/uL (ref 15–500)
Eosinophils Relative: 8.8 %
HCT: 46.3 % (ref 38.5–50.0)
Hemoglobin: 15.2 g/dL (ref 13.2–17.1)
Lymphs Abs: 1576 cells/uL (ref 850–3900)
MCH: 27.3 pg (ref 27.0–33.0)
MCHC: 32.8 g/dL (ref 32.0–36.0)
MCV: 83.1 fL (ref 80.0–100.0)
MPV: 10.4 fL (ref 7.5–12.5)
Monocytes Relative: 12.1 %
Neutro Abs: 1310 cells/uL — ABNORMAL LOW (ref 1500–7800)
Neutrophils Relative %: 35.4 %
Platelets: 234 10*3/uL (ref 140–400)
RBC: 5.57 10*6/uL (ref 4.20–5.80)
RDW: 14.1 % (ref 11.0–15.0)
Total Lymphocyte: 42.6 %
WBC: 3.7 10*3/uL — ABNORMAL LOW (ref 3.8–10.8)

## 2019-04-04 NOTE — Progress Notes (Signed)
   Subjective:    Patient ID: Mark Jarvis, male    DOB: April 28, 1980, 39 y.o.   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  He has known disc bulge.  He has had some sessions with chiropractor at [facility].  He is also had some physical therapy set up by the [place].  He also has chronic pain in his Achilles he maintains this with icing and NSAIDs as needed.      COVID 19 vaccine- 02/07/19  Moderna                                   03/07/19  Given at [facility]   Review Of Systems:  GEN- denies fatigue, fever, weight loss,weakness, recent illness HEENT- denies eye drainage, change in vision, nasal discharge, CVS- denies chest pain, palpitations RESP- denies SOB, cough, wheeze ABD- denies N/V, change in stools, abd pain GU- denies dysuria, hematuria, dribbling, incontinence MSK- denies joint pain, muscle aches, injury Neuro- denies headache, dizziness, syncope, seizure activity       Objective:    BP 118/66   Pulse 84   Temp 98 F (36.7 C) (Temporal)   Resp 16   Ht 6\' 2"  (1.88 m)   Wt 262 lb (118.8 kg)   SpO2 100%   BMI 33.64 kg/m  GEN- NAD, alert and oriented x3, obese HEENT- PERRL, EOMI, non injected sclera, pink conjunctiva, MMM, oropharynx clear Neck- Supple, no thyromegaly CVS- RRR, no murmur RESP-CTAB ABD-NABS,soft,NT,ND Psych normal affect and mood EXT- No edema Pulses- Radial, DP- 2+  Fall/CAGE/depression screen negative      Assessment & Plan:      Problem List Items Addressed This Visit      Unprioritized   Class 1 obesity    Other Visit Diagnoses    Routine general medical  examination at a health care facility    -  Primary   CPE done.  Fasting labs to be obtained.  Immunizations up-to-date.  Discussed dietary changes to reduce risk for coronary artery disease diabetes mellitus   Relevant Orders   CBC with Differential/Platelet   Comprehensive metabolic panel   Lipid panel      Note: This dictation was prepared with Dragon dictation along with smaller phrase technology. Any transcriptional errors that result from this process are unintentional.

## 2019-04-04 NOTE — Patient Instructions (Signed)
F/U 1 year for Physical  

## 2019-04-05 ENCOUNTER — Encounter: Payer: Self-pay | Admitting: Family Medicine

## 2019-04-05 DIAGNOSIS — E785 Hyperlipidemia, unspecified: Secondary | ICD-10-CM | POA: Insufficient documentation

## 2019-04-09 ENCOUNTER — Other Ambulatory Visit: Payer: Self-pay | Admitting: *Deleted

## 2019-04-09 DIAGNOSIS — E785 Hyperlipidemia, unspecified: Secondary | ICD-10-CM

## 2019-04-09 DIAGNOSIS — R7989 Other specified abnormal findings of blood chemistry: Secondary | ICD-10-CM

## 2019-04-16 ENCOUNTER — Other Ambulatory Visit: Payer: Self-pay

## 2019-04-16 ENCOUNTER — Encounter (HOSPITAL_COMMUNITY): Payer: Self-pay | Admitting: Emergency Medicine

## 2019-04-16 ENCOUNTER — Ambulatory Visit (HOSPITAL_COMMUNITY)
Admission: EM | Admit: 2019-04-16 | Discharge: 2019-04-16 | Disposition: A | Discharge status: Home / Self Care | Payer: 59 | Attending: Physician Assistant | Admitting: Physician Assistant

## 2019-04-16 DIAGNOSIS — R0781 Pleurodynia: Secondary | ICD-10-CM

## 2019-04-16 DIAGNOSIS — S39012A Strain of muscle, fascia and tendon of lower back, initial encounter: Secondary | ICD-10-CM

## 2019-04-16 DIAGNOSIS — M7918 Myalgia, other site: Secondary | ICD-10-CM

## 2019-04-16 MED ORDER — IBUPROFEN 600 MG PO TABS: 600.0000 mg | ORAL_TABLET | Freq: Four times a day (QID) | ORAL | 0 refills | Status: AC | PRN

## 2019-04-16 MED ORDER — CYCLOBENZAPRINE HCL 10 MG PO TABS: 10.0000 mg | ORAL_TABLET | Freq: Three times a day (TID) | ORAL | 0 refills | Status: DC | PRN
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
## 2019-07-17 ENCOUNTER — Ambulatory Visit: Payer: Managed Care, Other (non HMO)

## 2019-07-17 ENCOUNTER — Other Ambulatory Visit: Payer: Self-pay

## 2019-07-17 DIAGNOSIS — E785 Hyperlipidemia, unspecified: Secondary | ICD-10-CM

## 2019-07-17 DIAGNOSIS — R7989 Other specified abnormal findings of blood chemistry: Secondary | ICD-10-CM

## 2019-07-18 LAB — CBC WITH DIFFERENTIAL/PLATELET
Absolute Monocytes: 464 cells/uL (ref 200–950)
Basophils Absolute: 39 cells/uL (ref 0–200)
Basophils Relative: 0.9 %
Eosinophils Absolute: 172 cells/uL (ref 15–500)
Eosinophils Relative: 4 %
HCT: 45.4 % (ref 38.5–50.0)
Hemoglobin: 14.5 g/dL (ref 13.2–17.1)
Lymphs Abs: 1793 cells/uL (ref 850–3900)
MCH: 27 pg (ref 27.0–33.0)
MCHC: 31.9 g/dL — ABNORMAL LOW (ref 32.0–36.0)
MCV: 84.4 fL (ref 80.0–100.0)
MPV: 10.4 fL (ref 7.5–12.5)
Monocytes Relative: 10.8 %
Neutro Abs: 1832 cells/uL (ref 1500–7800)
Neutrophils Relative %: 42.6 %
Platelets: 261 10*3/uL (ref 140–400)
RBC: 5.38 10*6/uL (ref 4.20–5.80)
RDW: 13.6 % (ref 11.0–15.0)
Total Lymphocyte: 41.7 %
WBC: 4.3 10*3/uL (ref 3.8–10.8)

## 2019-07-18 LAB — LIPID PANEL
Cholesterol: 222 mg/dL — ABNORMAL HIGH (ref <200)
HDL: 53 mg/dL (ref >=40)
LDL Cholesterol (Calc): 139 mg/dL (calc) — ABNORMAL HIGH
Non-HDL Cholesterol (Calc): 169 mg/dL (calc) — ABNORMAL HIGH (ref <130)
Total CHOL/HDL Ratio: 4.2 (calc) (ref <5.0)
Triglycerides: 161 mg/dL — ABNORMAL HIGH (ref <150)

## 2019-11-05 ENCOUNTER — Encounter: Payer: Self-pay | Admitting: Physician Assistant

## 2019-11-05 ENCOUNTER — Other Ambulatory Visit: Payer: Self-pay

## 2019-11-05 ENCOUNTER — Ambulatory Visit: Payer: Managed Care, Other (non HMO) | Admitting: Physician Assistant

## 2019-11-05 VITALS — BP 130/74 | HR 68 | Temp 97.7°F | Resp 18 | Ht 74.0 in | Wt 268.0 lb

## 2019-11-05 DIAGNOSIS — Z0000 Encounter for general adult medical examination without abnormal findings: Secondary | ICD-10-CM | POA: Diagnosis not present

## 2019-11-05 DIAGNOSIS — Z008 Encounter for other general examination: Secondary | ICD-10-CM

## 2019-11-05 NOTE — Patient Instructions (Addendum)
We discussed the need for continuing scheduled work-out routine. We discussed the need to schedule time for self. I encouraged pt to monitor cholesterol amounts in all foods. Glucose and Lipid panel ordered. Recheck in 1 year.  A low fat, low cholesterol is discussed with the patient, and a written copy is given to him

## 2019-11-05 NOTE — Progress Notes (Signed)
   Subjective:    Patient ID: Mark Jarvis, male    DOB: 08-10-1980, 39 y.o.   MRN: [id number]  HPI Patient is a 39 year old male who presents today for biometric screening. The patient is employed with the [employer] . He has been with this local [employer] for two years period he had previously been with the [employer] Department for 10 years. He is enjoying his job, and states he is dealing with the various stresses of the job without any problem period he takes time out to refresh himself mentally and physically. He has a workout routine that involves workouts four times a week. He has no medical problems. He is not had any recent medical complications, surgeries, or hospitalizations.    Review of Systems  Constitutional: Negative for activity change.       All ROS Neg except as noted in HPI  Eyes: Negative for photophobia and discharge.  Respiratory: Negative for cough, chest tightness, shortness of breath and wheezing.   Cardiovascular: Negative for chest pain and palpitations.  Gastrointestinal: Negative for abdominal pain.  Musculoskeletal: Negative for arthralgias, back pain and neck pain.  Skin: Negative.   Neurological: Negative for dizziness, seizures and speech difficulty.  Psychiatric/Behavioral: Negative.        Objective:   Physical Exam Constitutional:      Appearance: Normal appearance. He is well-developed.  HENT:     Head: Normocephalic and atraumatic.     Right Ear: Tympanic membrane, ear canal and external ear normal.     Left Ear: Tympanic membrane, ear canal and external ear normal.     Nose: Nose normal.     Mouth/Throat:     Pharynx: Uvula midline.  Eyes:     General: Lids are normal.     Conjunctiva/sclera: Conjunctivae normal.     Pupils: Pupils are equal, round, and reactive to light.  Neck:     Vascular: No carotid bruit.     Trachea: Trachea and phonation normal.  Cardiovascular:     Rate and Rhythm: Normal rate and  regular rhythm.     Pulses: Normal pulses.  Abdominal:     General: Bowel sounds are normal.     Palpations: Abdomen is soft.  Musculoskeletal:        General: No tenderness. Normal range of motion.     Cervical back: Normal range of motion and neck supple.  Lymphadenopathy:     Head:     Right side of head: No submental, preauricular or posterior auricular adenopathy.     Left side of head: No submental, preauricular or posterior auricular adenopathy.     Cervical: No cervical adenopathy.  Skin:    General: Skin is warm and dry.  Neurological:     General: No focal deficit present.     Mental Status: He is alert and oriented to person, place, and time.     GCS: GCS eye subscore is 4. GCS verbal subscore is 5. GCS motor subscore is 6.  Psychiatric:        Mood and Affect: Mood normal.        Speech: Speech normal.           Assessment & Plan:

## 2019-11-06 LAB — LIPID PANEL
Chol/HDL Ratio: 5.7 ratio — ABNORMAL HIGH (ref 0.0–5.0)
Cholesterol, Total: 264 mg/dL — ABNORMAL HIGH (ref 100–199)
HDL: 46 mg/dL (ref >39)
LDL Chol Calc (NIH): 177 mg/dL — ABNORMAL HIGH (ref 0–99)
Triglycerides: 219 mg/dL — ABNORMAL HIGH (ref 0–149)
VLDL Cholesterol Cal: 41 mg/dL — ABNORMAL HIGH (ref 5–40)

## 2019-11-06 LAB — GLUCOSE, RANDOM: Glucose: 96 mg/dL (ref 65–99)

## 2019-11-07 ENCOUNTER — Encounter: Payer: Self-pay | Admitting: Physician Assistant

## 2020-03-24 ENCOUNTER — Encounter: Payer: Self-pay | Admitting: Nurse Practitioner

## 2020-03-24 ENCOUNTER — Other Ambulatory Visit: Payer: Self-pay

## 2020-03-24 ENCOUNTER — Encounter: Payer: Self-pay | Admitting: Family Medicine

## 2020-03-24 ENCOUNTER — Ambulatory Visit: Payer: Managed Care, Other (non HMO) | Admitting: Nurse Practitioner

## 2020-03-24 ENCOUNTER — Telehealth: Payer: Self-pay

## 2020-03-24 VITALS — BP 120/84 | HR 73 | Temp 97.5°F | Ht 74.0 in | Wt 270.0 lb

## 2020-03-24 DIAGNOSIS — J3089 Other allergic rhinitis: Secondary | ICD-10-CM | POA: Diagnosis not present

## 2020-03-24 DIAGNOSIS — G43109 Migraine with aura, not intractable, without status migrainosus: Secondary | ICD-10-CM | POA: Diagnosis not present

## 2020-03-24 DIAGNOSIS — E785 Hyperlipidemia, unspecified: Secondary | ICD-10-CM

## 2020-03-24 DIAGNOSIS — R519 Headache, unspecified: Secondary | ICD-10-CM | POA: Insufficient documentation

## 2020-03-24 MED ORDER — LEVOCETIRIZINE DIHYDROCHLORIDE 5 MG PO TABS: 5.0000 mg | ORAL_TABLET | Freq: Every evening | ORAL | 3 refills | Status: DC

## 2020-03-24 MED ORDER — SUMATRIPTAN SUCCINATE 25 MG PO TABS: 25.0000 mg | ORAL_TABLET | Freq: Once | ORAL | 0 refills | Status: DC

## 2020-03-24 NOTE — Assessment & Plan Note (Signed)
Noted at last visit-in October 2021.  Reports he recently had blood work with [place].  Records requested from patient when he gets a copy of them.  Continue fish oil supplement for now.

## 2020-03-24 NOTE — Telephone Encounter (Signed)
Pt brought in medical records from [place], sent to scan

## 2020-03-24 NOTE — Assessment & Plan Note (Signed)
Chronic, ongoing.  We will switch cetirizine to levocetirizine and monitor for benefit.  Consider allergy testing in future if symptoms persist despite the switch.

## 2020-03-24 NOTE — Patient Instructions (Addendum)
F/u as needed  [employer] you today, Mark Jarvis!  Please let us know if your symptoms remain uncontrolled after switching the allergy medication and we can get you in with an Allergist.  Use the Imitrex only very sparingly if the Tylenol and excedrin do not work.  Please drop by a copy of your most recent lab work from the [place] when you get it.  Take care!   Allergic Rhinitis, Adult Allergic rhinitis is a reaction to allergens. Allergens are things that can cause an allergic reaction. This condition affects the lining inside the nose (mucous membrane). There are two types of allergic rhinitis:  Seasonal. This type is also called hay fever. It happens only during some times of the year.  Perennial. This type can happen at any time of the year. This condition cannot be spread from person to person (is not contagious). It can be mild, worse, or very bad. It can develop at any age and may be outgrown. What are the causes? This condition may be caused by:  Pollen from grasses, trees, and weeds.  Dust mites.  Smoke.  Mold.  Car fumes.  The pee (urine), spit, or dander of pets. Dander is dead skin cells from a pet.   What increases the risk? You are more likely to develop this condition if:  You have allergies in your family.  You have problems like allergies in your family. You may have: ? Swelling of parts of your eyes and eyelids. ? Asthma. This affects how you breathe. ? Long-term redness and swelling on your skin. ? Food allergies. What are the signs or symptoms? The main symptom of this condition is a runny or stuffy nose (nasal congestion). Other symptoms may include:  Sneezing or coughing.  Itching and tearing of your eyes.  Mucus that drips down the back of your throat (postnasal drip).  Trouble sleeping.  Feeling tired.  Headache.  Sore throat. How is this treated? There is no cure for this condition. You should avoid things that you are allergic to.  Treatment can help to relieve symptoms. This may include:  Medicines that block allergy symptoms, such as corticosteroids or antihistamines. These may be given as a shot, nasal spray, or pill.  Avoiding things you are allergic to.  Medicines that give you bits of what you are allergic to over time. This is called immunotherapy. It is done if other treatments do not help. You may get: ? Shots. ? Medicine under your tongue.  Stronger medicines, if other treatments do not help. Follow these instructions at home: Avoiding allergens Find out what things you are allergic to and avoid them. To do this, try these things:  If you get allergies any time of year: ? Replace carpet with wood, tile, or vinyl flooring. Carpet can trap pet dander and dust. ? Do not smoke. Do not allow smoking in your home. ? Change your heating and air conditioning filters at least once a month.  If you get allergies only some times of the year: ? Keep windows closed when you can. ? Plan things to do outside when pollen counts are lowest. Check pollen counts before you plan things to do outside. ? When you come indoors, change your clothes and shower before you sit on furniture or bedding.   If you are allergic to a pet: ? Keep the pet out of your bedroom. ? Vacuum, sweep, and dust often.   General instructions  Take over-the-counter and prescription medicines only as  told by your doctor.  Drink enough fluid to keep your pee (urine) pale yellow.  Keep all follow-up visits as told by your doctor. This is important. Where to find more information  American Academy of Allergy, Asthma & Immunology: www.aaaai.org Contact a doctor if:  You have a fever.  You get a cough that does not go away.  You make whistling sounds when you breathe (wheeze).  Your symptoms slow you down.  Your symptoms stop you from doing your normal things each day. Get help right away if:  You are short of breath. This symptom  may be an emergency. Do not wait to see if the symptom will go away. Get medical help right away. Call your local emergency services (911 in the [place] Do not drive yourself to the hospital. Summary  Allergic rhinitis may be treated by taking medicines and avoiding things you are allergic to.  If you have allergies only some of the year, keep windows closed when you can at those times.  Contact your doctor if you get a fever or a cough that does not go away. This information is not intended to replace advice given to you by your health care provider. Make sure you discuss any questions you have with your health care provider. Document Revised: 03/05/2019 Document Reviewed: 01/09/2019 Elsevier Patient Education  2021 [employer].

## 2020-03-24 NOTE — Progress Notes (Signed)
Subjective:    Patient ID: Mark Jarvis, male    DOB: [birth year], 40 y.o.   MRN: [id number]  HPI: Mark Jarvis is a 40 y.o. male presenting for migraine and uncontrolled allergic rhinitis.  Chief Complaint  Patient presents with  . Migraine    Taking medication naproxen for this. Migraines come and go  . Nasal Congestion    Taking flonase and inhaler. Ongoing px, told from [place] [facility] to follow up with pcp.    MIGRAINE Duration: chronic Onset: sudden Severity: severe Frequency: intermittent Location: right side of head and goes across to left side Headache duration: 1 day Radiation: no Time of day headache occurs: random Alleviating factors: naprozen Aggravating factors: allergies Headache status at time of visit: current headache -Minor Treatments attempted: excedrin, dark room, Tylenol, naproxen, cold wash clothes, Topamax    Aura: yes Nausea:  yes Vomiting: yes Photophobia:  yes Phonophobia:  yes Effect on social functioning:  yes Numbers of missed days of work each month: 0 Confusion:  yes Gait disturbance/ataxia:  yes Behavioral changes:  no Fevers:  no  ALLERGIES Duration: years  Runny nose: yes "clear Nasal congestion: yes Nasal itching: yes Sneezing: yes Eye swelling, itching or discharge: yes Post nasal drip: yes Cough: yes; dry Sinus pressure: yes  Ear pain: left ear  Ear pressure: no Fever: no  Insomnia: yes; wakes up with stopped up nose Symptoms occur seasonally: no Symptoms occur perenially: yes Satisfied with current treatment: no Allergist evaluation in past: yes; "years ago" - reports shellfish Allergen injection immunotherapy: no Recurrent sinus infections: no ENT evaluation in past: no Known environmental allergy: no Indoor pets: no History of asthma: no Current allergy medications: flonase and zyrtec; helps but still has symptoms  Allergies  Allergen Reactions  . Pollen Extract     Multiple outdoor allergies year round      Outpatient Encounter Medications as of 03/24/2020  Medication Sig  . acetaminophen (TYLENOL) 500 MG tablet Take 1,000 mg by mouth 3 (three) times daily as needed for pain or fever.  . Cholecalciferol 100 MCG (4000 UT) TABS Take 1 tablet by mouth daily.  . fluticasone (FLONASE) 50 MCG/ACT nasal spray Place 50 sprays into both nostrils daily.  Marland Kitchen ibuprofen (ADVIL) 600 MG tablet Take 1 tablet (600 mg total) by mouth every 6 (six) hours as needed.  Marland Kitchen ipratropium (ATROVENT) 0.03 % nasal spray Place 2 sprays every 12 (twelve) hours into both nostrils. As needed  . levocetirizine (XYZAL) 5 MG tablet Take 1 tablet (5 mg total) by mouth every evening.  . montelukast (SINGULAIR) 10 MG tablet Take 10 mg by mouth at bedtime.  . naproxen (NAPROSYN) 500 MG tablet TAKE ONE TABLET BY MOUTH TWICE A DAY AS NEEDED (TAKE WITH FOOD) FOR HEADACHE, BACK PAIN OR JOINT PAIN.  Marland Kitchen Omega-3 Fatty Acids (FISH OIL) 1000 MG CAPS Take 2 capsules by mouth daily.  . SUMAtriptan (IMITREX) 25 MG tablet Take 1 tablet (25 mg total) by mouth once for 1 dose. May repeat in 2 hours if headache persists or recurs.  . [DISCONTINUED] cetirizine (ZYRTEC) 10 MG tablet Take 10 mg by mouth daily.   No facility-administered encounter medications on file as of 03/24/2020.    Patient Active Problem List   Diagnosis Date Noted  . Headache 03/24/2020  . Migraine with aura and without status migrainosus, not intractable 03/24/2020  . Hyperlipidemia 04/05/2019  . Class 1 obesity 12/21/2016  . Chronic back pain 11/30/2016  . Low  back pain 11/30/2016  . L4-L5 disc bulge 11/30/2016  . Pes planus of both feet 12/17/2014  . 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 Allergic rhinitis 02/25/2014    Past Medical History:  Diagnosis Date  . Allergic rhinitis   . Allergy    tree pollens,dust mites,cats,grasses  . HSV-1 infection   . Multiple food allergies 11/16/2012    Relevant past medical, surgical, family and social history reviewed and  updated as indicated. Interim medical history since our last visit reviewed.  Review of Systems Per HPI unless specifically indicated above     Objective:    BP 120/84   Pulse 73   Temp (!) 97.5 F (36.4 C)   Ht 6\' 2"  (1.88 m)   Wt 270 lb (122.5 kg)   SpO2 97%   BMI 34.67 kg/m   Wt Readings from Last 3 Encounters:  03/24/20 270 lb (122.5 kg)  11/05/19 268 lb (121.6 kg)  04/04/19 262 lb (118.8 kg)    Physical Exam Vitals and nursing note reviewed.  Constitutional:      General: He is not in acute distress.    Appearance: Normal appearance. He is not toxic-appearing.  HENT:     Head: Normocephalic and atraumatic.     Right Ear: Tympanic membrane, ear canal and external ear normal.     Left Ear: Tympanic membrane, ear canal and external ear normal.     Nose: Nose normal. No congestion or rhinorrhea.     Mouth/Throat:     Mouth: Mucous membranes are moist.     Pharynx: Oropharynx is clear. Posterior oropharyngeal erythema present.  Eyes:     General: No scleral icterus.       Right eye: No discharge.        Left eye: No discharge.     Extraocular Movements: Extraocular movements intact.     Pupils: Pupils are equal, round, and reactive to light.  Cardiovascular:     Rate and Rhythm: Normal rate and regular rhythm.     Heart sounds: Normal heart sounds. No murmur heard.   Pulmonary:     Effort: Pulmonary effort is normal. No respiratory distress.     Breath sounds: Normal breath sounds. No wheezing, rhonchi or rales.  Musculoskeletal:        General: Normal range of motion.     Cervical back: Normal range of motion and neck supple. No rigidity or tenderness.     Right lower leg: No edema.     Left lower leg: No edema.  Lymphadenopathy:     Cervical: No cervical adenopathy.  Skin:    General: Skin is warm and dry.     Capillary Refill: Capillary refill takes less than 2 seconds.     Coloration: Skin is not jaundiced or pale.     Findings: No erythema.   Neurological:     Mental Status: He is alert and oriented to person, place, and time.     Cranial Nerves: Cranial nerves are intact.     Sensory: No sensory deficit.     Motor: No weakness.     Gait: Gait normal.  Psychiatric:        Mood and Affect: Mood normal.        Behavior: Behavior normal.        Thought Content: Thought content normal.        Judgment: Judgment normal.       Assessment & Plan:   Problem List Items Addressed This Visit  Cardiovascular and Mediastinum   Migraine with aura and without status migrainosus, not intractable - Primary    Chronic, ongoing.  No red flags in history today or on examination.  With relief from Tylenol and/or Excedrin, will continue these as first-line treatment.  If the first-line treatment does not help, prescription given for Imitrex #10. if symptoms worsen or with any neurological defects, consider referral to neurology.      Relevant Medications   naproxen (NAPROSYN) 500 MG tablet   SUMAtriptan (IMITREX) 25 MG tablet   acetaminophen (TYLENOL) 500 MG tablet     Respiratory   Allergic rhinitis    Chronic, ongoing.  We will switch cetirizine to levocetirizine and monitor for benefit.  Consider allergy testing in future if symptoms persist despite the switch.      Relevant Medications   levocetirizine (XYZAL) 5 MG tablet     Other   Hyperlipidemia    Noted at last visit-in October 2021.  Reports he recently had blood work with [place].  Records requested from patient when he gets a copy of them.  Continue fish oil supplement for now.          Follow up plan: Return if symptoms worsen or fail to improve.

## 2020-03-24 NOTE — Assessment & Plan Note (Signed)
Chronic, ongoing.  No red flags in history today or on examination.  With relief from Tylenol and/or Excedrin, will continue these as first-line treatment.  If the first-line treatment does not help, prescription given for Imitrex #10. if symptoms worsen or with any neurological defects, consider referral to neurology.

## 2020-07-23 ENCOUNTER — Encounter: Payer: Self-pay | Admitting: Nurse Practitioner

## 2020-07-23 ENCOUNTER — Ambulatory Visit (INDEPENDENT_AMBULATORY_CARE_PROVIDER_SITE_OTHER): Payer: Managed Care, Other (non HMO) | Admitting: Nurse Practitioner

## 2020-07-23 ENCOUNTER — Other Ambulatory Visit: Payer: Self-pay

## 2020-07-23 VITALS — BP 130/78 | HR 71 | Temp 98.4°F | Ht 74.0 in | Wt 271.8 lb

## 2020-07-23 DIAGNOSIS — Z0001 Encounter for general adult medical examination with abnormal findings: Secondary | ICD-10-CM

## 2020-07-23 DIAGNOSIS — Z131 Encounter for screening for diabetes mellitus: Secondary | ICD-10-CM

## 2020-07-23 DIAGNOSIS — E785 Hyperlipidemia, unspecified: Secondary | ICD-10-CM

## 2020-07-23 DIAGNOSIS — Z6834 Body mass index (BMI) 34.0-34.9, adult: Secondary | ICD-10-CM

## 2020-07-23 DIAGNOSIS — Z0000 Encounter for general adult medical examination without abnormal findings: Secondary | ICD-10-CM

## 2020-07-23 NOTE — Progress Notes (Signed)
BP 130/78   Pulse 71   Temp 98.4 F (36.9 C)   Ht 6\' 2"  (1.88 m)   Wt 271 lb 12.8 oz (123.3 kg)   SpO2 95%   BMI 34.90 kg/m    Subjective:    Patient ID: Mark Jarvis, male    DOB: 1980-05-02, 40 y.o.   MRN: [id number]  HPI: Mark Jarvis is a 40 y.o. male presenting on 07/23/2020 for comprehensive medical examination. Current medical complaints include: none  Headaches are somewhat better.  Has not reached out to Neurology to schedule appointment.  Tries to stay physically active - goes to gym, lift weights, does some cardio.  Does not eat out a lot.  Tries to make healthy choice.  Lives with wife, feels safe.   Depression Screen done today and results listed below:  Depression screen [facility] 2/9 07/23/2020 04/04/2019 12/21/2016 04/12/2016 01/14/2016  Decreased Interest 0 0 0 0 0  Down, Depressed, Hopeless 0 0 0 0 0  PHQ - 2 Score 0 0 0 0 0  Altered sleeping - - - 0 0  Tired, decreased energy - - - 0 0  Change in appetite - - - 0 0  Feeling bad or failure about yourself  - - - 0 0  Trouble concentrating - - - 0 0  Moving slowly or fidgety/restless - - - 0 0  Suicidal thoughts - - - 0 0  PHQ-9 Score - - - 0 0  Difficult doing work/chores - - - Not difficult at all -   GAD 7 : Generalized Anxiety Score 07/23/2020  Nervous, Anxious, on Edge 0  Control/stop worrying 0  Worry too much - different things 0  Trouble relaxing 0  Restless 0  Easily annoyed or irritable 0  Afraid - awful might happen 0  Total GAD 7 Score 0  Anxiety Difficulty Not difficult at all    The patient does not have a history of falls. I did not complete a risk assessment for falls. A plan of care for falls was not documented.   Past Medical History:  Past Medical History:  Diagnosis Date   Allergic rhinitis    Allergy    tree pollens,dust mites,cats,grasses   HSV-1 infection    Multiple food allergies 11/16/2012    Surgical History:  History reviewed. No pertinent surgical  history.  Medications:  Current Outpatient Medications on File Prior to Visit  Medication Sig   acetaminophen (TYLENOL) 500 MG tablet Take 1,000 mg by mouth 3 (three) times daily as needed for pain or fever.   cetirizine (ZYRTEC) 10 MG tablet Take 1 tablet by mouth daily as needed.   Cholecalciferol 100 MCG (4000 UT) TABS Take 1 tablet by mouth daily.   fluticasone (FLONASE) 50 MCG/ACT nasal spray Place 50 sprays into both nostrils daily.   fluticasone (FLONASE) 50 MCG/ACT nasal spray Place into the nose.   ibuprofen (ADVIL) 600 MG tablet Take 1 tablet (600 mg total) by mouth every 6 (six) hours as needed.   ipratropium (ATROVENT) 0.03 % nasal spray Place 2 sprays every 12 (twelve) hours into both nostrils. As needed   levocetirizine (XYZAL) 5 MG tablet Take 1 tablet (5 mg total) by mouth every evening.   montelukast (SINGULAIR) 10 MG tablet Take 10 mg by mouth at bedtime.   Omega-3 Fatty Acids (FISH OIL) 1000 MG CAPS Take 2 capsules by mouth daily.   SUMAtriptan (IMITREX) 25 MG tablet Take 1 tablet (25 mg total) by  mouth once for 1 dose. May repeat in 2 hours if headache persists or recurs.   No current facility-administered medications on file prior to visit.    Allergies:  Allergies  Allergen Reactions   Pollen Extract     Multiple outdoor allergies year round     Social History:  Social History   Socioeconomic History   Marital status: Married    Spouse name: Not on file   Number of children: Not on file   Years of education: Not on file   Highest education level: Not on file  Occupational History   [place]up[place] [place] [employer]   Occupation: [employer]  Tobacco Use   Smoking status: Never   Smokeless tobacco: Never  Building services engineerVaping Use   Vaping Use: Never used  Substance and Sexual Activity   Alcohol use: Yes   Drug use: Never   Sexual activity: Yes    Partners: Female  Other Topics Concern   Not on file  Social History Narrative   Works  [employer]   [employer]   Exercises: Mon/ Wed/Fri--Lifts Weights                    Tues/ Thurs--Runs 1.5 miles then leg exercises.      Single. Lives alone.   Social Determinants of Health   Financial Resource Strain: Not on file  Food Insecurity: Not on file  Transportation Needs: Not on file  Physical Activity: Not on file  Stress: Not on file  Social Connections: Not on file  Intimate Partner Violence: Not on file   Social History   Tobacco Use  Smoking Status Never  Smokeless Tobacco Never   Social History   Substance and Sexual Activity  Alcohol Use Yes    Family History:  History reviewed. No pertinent family history.  Past medical history, surgical history, medications, allergies, family history and social history reviewed with patient today and changes made to appropriate areas of the chart.   Review of Systems  Constitutional: Negative.   HENT: Negative.    Eyes: Negative.   Respiratory: Negative.    Cardiovascular: Negative.   Gastrointestinal: Negative.   Genitourinary: Negative.   Musculoskeletal: Negative.   Skin: Negative.   Neurological: Negative.   Psychiatric/Behavioral: Negative.        Objective:    BP 130/78   Pulse 71   Temp 98.4 F (36.9 C)   Ht 6\' 2"  (1.88 m)   Wt 271 lb 12.8 oz (123.3 kg)   SpO2 95%   BMI 34.90 kg/m   Wt Readings from Last 3 Encounters:  07/23/20 271 lb 12.8 oz (123.3 kg)  03/24/20 270 lb (122.5 kg)  11/05/19 268 lb (121.6 kg)    Physical Exam Constitutional:      General: He is not in acute distress.    Appearance: Normal appearance. He is normal weight. He is not toxic-appearing.  HENT:     Head: Normocephalic and atraumatic.     Right Ear: Tympanic membrane, ear canal and external ear normal. There is no impacted cerumen.     Left Ear: Tympanic membrane, ear canal and external ear normal. There is no impacted cerumen.     Nose: Nose normal. No congestion.      Mouth/Throat:     Mouth: Mucous membranes are moist.     Pharynx: Oropharynx is clear. No oropharyngeal exudate or posterior oropharyngeal erythema.  Eyes:  General: No scleral icterus.       Right eye: No discharge.        Left eye: No discharge.     Extraocular Movements: Extraocular movements intact.     Pupils: Pupils are equal, round, and reactive to light.  Neck:     Vascular: No carotid bruit.  Cardiovascular:     Rate and Rhythm: Normal rate and regular rhythm.     Heart sounds: Normal heart sounds. No murmur heard.   No gallop.  Pulmonary:     Effort: Pulmonary effort is normal. No respiratory distress.     Breath sounds: Normal breath sounds. No wheezing or rhonchi.  Abdominal:     General: Abdomen is flat. Bowel sounds are normal. There is no distension.     Palpations: Abdomen is soft.     Tenderness: There is no abdominal tenderness. There is no right CVA tenderness or left CVA tenderness.  Musculoskeletal:        General: No swelling or tenderness. Normal range of motion.     Cervical back: Normal range of motion and neck supple. No tenderness.     Right lower leg: No edema.     Left lower leg: No edema.  Skin:    General: Skin is warm and dry.     Capillary Refill: Capillary refill takes less than 2 seconds.     Coloration: Skin is not jaundiced or pale.     Findings: No erythema or lesion.  Neurological:     General: No focal deficit present.     Mental Status: He is alert and oriented to person, place, and time. Mental status is at baseline.  Psychiatric:        Mood and Affect: Mood normal.        Behavior: Behavior normal.        Thought Content: Thought content normal.        Judgment: Judgment normal.      Assessment & Plan:   Problem List Items Addressed This Visit       Other   Hyperlipidemia    Chronic.  Lipids rechecked today - patient is fasting.  Continue dietary changes and reducing saturated fats in diet.  Follow up pending lab  results.       Relevant Orders   Lipid panel   COMPLETE METABOLIC PANEL WITH GFR   CBC with Differential/Platelet   Other Visit Diagnoses     Annual physical exam    -  Primary   Relevant Orders   Lipid panel   COMPLETE METABOLIC PANEL WITH GFR   CBC with Differential/Platelet   BMI 34.0-34.9,adult       Screening for diabetes mellitus       Relevant Orders   Hemoglobin A1c        Discussed aspirin prophylaxis for myocardial infarction prevention and decision was it was not indicated  LABORATORY TESTING:  Health maintenance labs ordered today as discussed above.   IMMUNIZATIONS:   - Tdap: Tetanus vaccination status reviewed: last tetanus booster within 10 years. - Influenza: Up to date - Pneumovax: Not applicable - Prevnar: Not applicable - HPV: Not applicable - Zostavax vaccine: Not applicable - COVID-19 vaccine: 2 vaccines with Moderna and 1 booster  SCREENING: - Colonoscopy: Not applicable  Discussed with patient purpose of the colonoscopy is to detect colon cancer at curable precancerous or early stages   - AAA Screening: Not applicable  -Hearing Test: Not applicable  -Spirometry: Not applicable  PATIENT COUNSELING:    Sexuality: Discussed sexually transmitted diseases, partner selection, use of condoms, avoidance of unintended pregnancy  and contraceptive alternatives.   Advised to avoid cigarette smoking.  I discussed with the patient that most people either abstain from alcohol or drink within safe limits (<=14/week and <=4 drinks/occasion for males, <=7/weeks and <= 3 drinks/occasion for females) and that the risk for alcohol disorders and other health effects rises proportionally with the number of drinks per week and how often a drinker exceeds daily limits.  Discussed cessation/primary prevention of drug use and availability of treatment for abuse.   Diet: Encouraged to adjust caloric intake to maintain  or achieve ideal body weight, to reduce  intake of dietary saturated fat and total fat, to limit sodium intake by avoiding high sodium foods and not adding table salt, and to maintain adequate dietary potassium and calcium preferably from fresh fruits, vegetables, and low-fat dairy products.    stressed the importance of regular exercise  Injury prevention: Discussed safety belts, safety helmets, smoke detector, smoking near bedding or upholstery.   Dental health: Discussed importance of regular tooth brushing, flossing, and dental visits.   Follow up plan: NEXT PREVENTATIVE PHYSICAL DUE IN 1 YEAR. Return for pending lab work.

## 2020-07-23 NOTE — Assessment & Plan Note (Signed)
Chronic.  Lipids rechecked today - patient is fasting.  Continue dietary changes and reducing saturated fats in diet.  Follow up pending lab results.

## 2020-07-24 ENCOUNTER — Encounter: Payer: Self-pay | Admitting: Nurse Practitioner

## 2020-07-24 DIAGNOSIS — R7303 Prediabetes: Secondary | ICD-10-CM | POA: Insufficient documentation

## 2020-07-24 LAB — COMPLETE METABOLIC PANEL WITH GFR
AG Ratio: 1.4 (calc) (ref 1.0–2.5)
ALT: 44 U/L (ref 9–46)
AST: 35 U/L (ref 10–40)
Albumin: 4.2 g/dL (ref 3.6–5.1)
Alkaline phosphatase (APISO): 58 U/L (ref 36–130)
BUN: 16 mg/dL (ref 7–25)
CO2: 25 mmol/L (ref 20–32)
Calcium: 9.5 mg/dL (ref 8.6–10.3)
Chloride: 105 mmol/L (ref 98–110)
Creat: 1.1 mg/dL (ref 0.60–1.35)
GFR, Est African American: 98 mL/min/{1.73_m2} (ref >=60)
GFR, Est Non African American: 85 mL/min/{1.73_m2} (ref >=60)
Globulin: 3.1 g/dL (calc) (ref 1.9–3.7)
Glucose, Bld: 86 mg/dL (ref 65–99)
Potassium: 4.2 mmol/L (ref 3.5–5.3)
Sodium: 139 mmol/L (ref 135–146)
Total Bilirubin: 0.7 mg/dL (ref 0.2–1.2)
Total Protein: 7.3 g/dL (ref 6.1–8.1)

## 2020-07-24 LAB — CBC WITH DIFFERENTIAL/PLATELET
Absolute Monocytes: 533 cells/uL (ref 200–950)
Basophils Absolute: 30 cells/uL (ref 0–200)
Basophils Relative: 0.7 %
Eosinophils Absolute: 254 cells/uL (ref 15–500)
Eosinophils Relative: 5.9 %
HCT: 44.8 % (ref 38.5–50.0)
Hemoglobin: 14.7 g/dL (ref 13.2–17.1)
Lymphs Abs: 1686 cells/uL (ref 850–3900)
MCH: 27.2 pg (ref 27.0–33.0)
MCHC: 32.8 g/dL (ref 32.0–36.0)
MCV: 82.8 fL (ref 80.0–100.0)
MPV: 10.3 fL (ref 7.5–12.5)
Monocytes Relative: 12.4 %
Neutro Abs: 1797 cells/uL (ref 1500–7800)
Neutrophils Relative %: 41.8 %
Platelets: 257 10*3/uL (ref 140–400)
RBC: 5.41 10*6/uL (ref 4.20–5.80)
RDW: 13.7 % (ref 11.0–15.0)
Total Lymphocyte: 39.2 %
WBC: 4.3 10*3/uL (ref 3.8–10.8)

## 2020-07-24 LAB — LIPID PANEL
Cholesterol: 249 mg/dL — ABNORMAL HIGH (ref <200)
HDL: 44 mg/dL (ref >=40)
LDL Cholesterol (Calc): 179 mg/dL (calc) — ABNORMAL HIGH
Non-HDL Cholesterol (Calc): 205 mg/dL (calc) — ABNORMAL HIGH (ref <130)
Total CHOL/HDL Ratio: 5.7 (calc) — ABNORMAL HIGH (ref <5.0)
Triglycerides: 124 mg/dL (ref <150)

## 2020-07-24 LAB — HEMOGLOBIN A1C
Hgb A1c MFr Bld: 5.8 % of total Hgb — ABNORMAL HIGH (ref <5.7)
Mean Plasma Glucose: 120 mg/dL
eAG (mmol/L): 6.6 mmol/L

## 2021-07-24 ENCOUNTER — Ambulatory Visit (HOSPITAL_COMMUNITY)
Admission: EM | Admit: 2021-07-24 | Discharge: 2021-07-24 | Disposition: A | Discharge status: Home / Self Care | Payer: Managed Care, Other (non HMO) | Attending: Emergency Medicine | Admitting: Emergency Medicine

## 2021-07-24 ENCOUNTER — Encounter (HOSPITAL_COMMUNITY): Payer: Self-pay | Admitting: Emergency Medicine

## 2021-07-24 DIAGNOSIS — T63441A Toxic effect of venom of bees, accidental (unintentional), initial encounter: Secondary | ICD-10-CM

## 2021-07-24 MED ORDER — DEXAMETHASONE SODIUM PHOSPHATE 10 MG/ML IJ SOLN
INTRAMUSCULAR | Status: AC
  Filled 2021-07-24: qty 1

## 2021-07-24 MED ORDER — DEXAMETHASONE SODIUM PHOSPHATE 10 MG/ML IJ SOLN
10.0000 mg | Freq: Once | INTRAMUSCULAR | Status: AC
  Administered 2021-07-24: 10 mg via INTRAMUSCULAR

## 2021-07-24 NOTE — ED Triage Notes (Signed)
Pt got stung by bees on bilat lower legs on Wed. Pt still has some swelling.

## 2021-07-24 NOTE — ED Provider Notes (Signed)
[facility]    CSN: [id number] Arrival date & time: 07/24/21  1053     History   Chief Complaint Chief Complaint  Patient presents with   Insect Bite    HPI Mark Jarvis is a 41 y.o. male.  Presents after multiple bee stings to the bilateral lower legs.  Wednesday was mowing the grass when he was attacked by a swarm of bees.  Lots of swelling to the areas of the stings, reports swelling is better today but still present.  He tried Benadryl 2 times that seemed to help.  Reports itching to the areas. Denies any shortness of breath, trouble breathing, lip or tongue swelling, abdominal pain, vomiting/diarrhea. No history of severe allergic reaction to bee stings in the past.  He takes daily Zyrtec.  Past Medical History:  Diagnosis Date   Allergic rhinitis    Allergy    tree pollens,dust mites,cats,grasses   HSV-1 infection    Multiple food allergies 11/16/2012    Patient Active Problem List   Diagnosis Date Noted   Prediabetes 07/24/2020   Headache 03/24/2020   Migraine with aura and without status migrainosus, not intractable 03/24/2020   Hyperlipidemia 04/05/2019   Class 1 obesity 12/21/2016   Chronic back pain 11/30/2016   Low back pain 11/30/2016   L4-L5 disc bulge 11/30/2016   Pes planus of both feet 12/17/2014   Achilles tendinosis 11/26/2014   Allergic rhinitis 02/25/2014    History reviewed. No pertinent surgical history.     Home Medications    Prior to Admission medications   Medication Sig Start Date End Date Taking? Authorizing Provider  acetaminophen (TYLENOL) 500 MG tablet Take 1,000 mg by mouth 3 (three) times daily as needed for pain or fever. 01/14/20   [provider]  cetirizine (ZYRTEC) 10 MG tablet Take 1 tablet by mouth daily as needed. 07/08/20   [provider]  ibuprofen (ADVIL) 600 MG tablet Take 1 tablet (600 mg total) by mouth every 6 (six) hours as needed. 04/16/19   Darr, Gerilyn Pilgrim, PA-C    Family  History No family history on file.  Social History Social History   Tobacco Use   Smoking status: Never   Smokeless tobacco: Never  Vaping Use   Vaping Use: Never used  Substance Use Topics   Alcohol use: Yes   Drug use: Never     Allergies   Pollen extract   Review of Systems Review of Systems Per HPI  Physical Exam Triage Vital Signs ED Triage Vitals  Enc Vitals Group     BP 07/24/21 1149 132/90     Pulse Rate 07/24/21 1149 65     Resp 07/24/21 1149 17     Temp 07/24/21 1149 98.5 F (36.9 C)     Temp Source 07/24/21 1149 Oral     SpO2 07/24/21 1149 100 %     Weight --      Height --      Head Circumference --      Peak Flow --      Pain Score 07/24/21 1148 0     Pain Loc --      Pain Edu? --      Excl. in GC? --    No data found.  Updated Vital Signs BP 132/90 (BP Location: Left Arm)   Pulse 65   Temp 98.5 F (36.9 C) (Oral)   Resp 17   SpO2 100%     Physical Exam Vitals and nursing  note reviewed.  Constitutional:      General: He is not in acute distress.    Appearance: Normal appearance.  HENT:     Mouth/Throat:     Pharynx: Oropharynx is clear.  Eyes:     Conjunctiva/sclera: Conjunctivae normal.  Cardiovascular:     Rate and Rhythm: Normal rate and regular rhythm.     Heart sounds: Normal heart sounds.  Pulmonary:     Effort: Pulmonary effort is normal. No respiratory distress.     Breath sounds: Normal breath sounds. No wheezing, rhonchi or rales.  Abdominal:     Tenderness: There is no abdominal tenderness.  Musculoskeletal:        General: Normal range of motion.     Cervical back: Normal range of motion and neck supple.  Skin:    Comments: Multiple bee stings to the bilateral legs, minor swelling and some erythema.  No pain with palpation  Neurological:     Mental Status: He is alert and oriented to person, place, and time.     UC Treatments / Results  Labs (all labs ordered are listed, but only abnormal results are  displayed) Labs Reviewed - No data to display  EKG  Radiology No results found.  Procedures Procedures (including critical care time)  Medications Ordered in UC Medications  dexamethasone (DECADRON) injection 10 mg (10 mg Intramuscular Given 07/24/21 1240)    Initial Impression / Assessment and Plan / UC Course  I have reviewed the triage vital signs and the nursing notes.  Pertinent labs & imaging results that were available during my care of the patient were reviewed by me and considered in my medical decision making (see chart for details).  10 mg IM Decadron dose given in clinic for itching and swelling.  I recommend he try daily oral Benadryl and/or topical anti-itch creams.  Keep an eye on the bites to watch for any worsening swelling or development of signs of infection.  Understands to go to the emergency department if he develops any airway or respiratory symptoms. Return precautions discussed. Patient agrees to plan and is discharged in stable condition.  Final Clinical Impressions(s) / UC Diagnoses   Final diagnoses:  Bee sting, accidental or unintentional, initial encounter     Discharge Instructions      Try daily zyrtec or benadryl for swelling and itch. You can try topical benadryl cream for relief as well. It's also called diphenhydramine cream. Try cool compress as well.  Please keep any eye out for any worsening symptoms or signs of infection.    ED Prescriptions   None    PDMP not reviewed this encounter.   Marlow Baars, Cordelia Poche 07/24/21 1242

## 2021-07-24 NOTE — Discharge Instructions (Addendum)
Try daily zyrtec or benadryl for swelling and itch. You can try topical benadryl cream for relief as well. It's also called diphenhydramine cream. Try cool compress as well.  Please keep any eye out for any worsening symptoms or signs of infection.

## 2022-04-23 ENCOUNTER — Ambulatory Visit (INDEPENDENT_AMBULATORY_CARE_PROVIDER_SITE_OTHER): Payer: Managed Care, Other (non HMO)

## 2022-04-23 ENCOUNTER — Encounter (HOSPITAL_COMMUNITY): Payer: Self-pay

## 2022-04-23 ENCOUNTER — Ambulatory Visit (HOSPITAL_COMMUNITY)
Admission: EM | Admit: 2022-04-23 | Discharge: 2022-04-23 | Disposition: A | Discharge status: Home / Self Care | Payer: Managed Care, Other (non HMO)

## 2022-04-23 DIAGNOSIS — M542 Cervicalgia: Secondary | ICD-10-CM | POA: Diagnosis not present

## 2022-04-23 DIAGNOSIS — S39012A Strain of muscle, fascia and tendon of lower back, initial encounter: Secondary | ICD-10-CM | POA: Diagnosis not present

## 2022-04-23 DIAGNOSIS — M545 Low back pain, unspecified: Secondary | ICD-10-CM

## 2022-04-23 DIAGNOSIS — S060X9A Concussion with loss of consciousness of unspecified duration, initial encounter: Secondary | ICD-10-CM

## 2022-04-23 DIAGNOSIS — S161XXA Strain of muscle, fascia and tendon at neck level, initial encounter: Secondary | ICD-10-CM | POA: Diagnosis not present

## 2022-04-23 MED ORDER — METHOCARBAMOL 500 MG PO TABS: 500.0000 mg | ORAL_TABLET | Freq: Two times a day (BID) | ORAL | 0 refills | Status: AC

## 2022-04-23 MED ORDER — KETOROLAC TROMETHAMINE 60 MG/2ML IM SOLN
INTRAMUSCULAR | Status: AC
  Filled 2022-04-23: qty 2

## 2022-04-23 MED ORDER — NAPROXEN 500 MG PO TABS: 500.0000 mg | ORAL_TABLET | Freq: Two times a day (BID) | ORAL | 0 refills | Status: AC

## 2022-04-23 MED ORDER — KETOROLAC TROMETHAMINE 60 MG/2ML IM SOLN
60.0000 mg | Freq: Once | INTRAMUSCULAR | Status: AC
  Administered 2022-04-23: 60 mg via INTRAMUSCULAR
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
## 2022-11-11 ENCOUNTER — Other Ambulatory Visit (HOSPITAL_BASED_OUTPATIENT_CLINIC_OR_DEPARTMENT_OTHER): Payer: Self-pay | Admitting: Family Medicine

## 2022-11-11 DIAGNOSIS — E785 Hyperlipidemia, unspecified: Secondary | ICD-10-CM

## 2022-12-08 ENCOUNTER — Ambulatory Visit (HOSPITAL_BASED_OUTPATIENT_CLINIC_OR_DEPARTMENT_OTHER)
Admission: RE | Admit: 2022-12-08 | Discharge: 2022-12-08 | Disposition: A | Discharge status: Home / Self Care | Payer: Managed Care, Other (non HMO) | Source: Ambulatory Visit | Attending: Family Medicine | Admitting: Family Medicine

## 2022-12-08 DIAGNOSIS — E785 Hyperlipidemia, unspecified: Secondary | ICD-10-CM | POA: Insufficient documentation

## 2024-01-14 ENCOUNTER — Ambulatory Visit (HOSPITAL_COMMUNITY): Admission: EM | Admit: 2024-01-14 | Discharge: 2024-01-14 | Disposition: A | Discharge status: Home / Self Care

## 2024-01-14 ENCOUNTER — Ambulatory Visit (HOSPITAL_COMMUNITY)

## 2024-01-14 DIAGNOSIS — S60221A Contusion of right hand, initial encounter: Secondary | ICD-10-CM

## 2024-01-14 MED ORDER — DICLOFENAC SODIUM 50 MG PO TBEC: 50.0000 mg | DELAYED_RELEASE_TABLET | Freq: Two times a day (BID) | ORAL | 1 refills | Status: AC
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
## 2024-01-14 NOTE — Discharge Instructions (Addendum)
" °  1. Contusion of right hand, initial encounter (Primary) - DG Hand Complete Right x-ray completed in UC shows no acute fracture or dislocation - Apply ace wrap for protection and compression of right hand until swelling and injury improved. - diclofenac  (VOLTAREN ) 50 MG EC tablet; Take 1 tablet (50 mg total) by mouth 2 (two) times daily.  Dispense: 30 tablet; Refill: 1  -Continue to monitor symptoms for any change in severity if there is any escalation of current symptoms or development of new symptoms follow-up in ER for further evaluation and management. "
